# Patient Record
Sex: Male | Born: 1965
Health system: Southern US, Community
[De-identification: ages and names within clinical notes are randomized; demographics above are authoritative.]

## PROBLEM LIST (undated history)

## (undated) DIAGNOSIS — E785 Hyperlipidemia, unspecified: Secondary | ICD-10-CM

## (undated) DIAGNOSIS — I10 Essential (primary) hypertension: Secondary | ICD-10-CM

## (undated) DIAGNOSIS — K219 Gastro-esophageal reflux disease without esophagitis: Secondary | ICD-10-CM

## (undated) DIAGNOSIS — R7303 Prediabetes: Secondary | ICD-10-CM

## (undated) HISTORY — DX: Hyperlipidemia, unspecified: E78.5

## (undated) HISTORY — DX: Prediabetes: R73.03

---

## 2011-09-04 ENCOUNTER — Emergency Department: Payer: Self-pay | Admitting: Emergency Medicine

## 2012-07-03 ENCOUNTER — Emergency Department: Payer: Self-pay | Admitting: Emergency Medicine

## 2014-05-12 ENCOUNTER — Emergency Department: Payer: Self-pay | Admitting: Emergency Medicine

## 2015-05-07 ENCOUNTER — Emergency Department
Admission: EM | Admit: 2015-05-07 | Discharge: 2015-05-07 | Disposition: A | Discharge status: Home / Self Care | Payer: No Typology Code available for payment source | Attending: Emergency Medicine | Admitting: Emergency Medicine

## 2015-05-07 ENCOUNTER — Encounter: Payer: Self-pay | Admitting: Emergency Medicine

## 2015-05-07 DIAGNOSIS — Z98818 Other dental procedure status: Secondary | ICD-10-CM | POA: Insufficient documentation

## 2015-05-07 DIAGNOSIS — K0889 Other specified disorders of teeth and supporting structures: Secondary | ICD-10-CM

## 2015-05-07 DIAGNOSIS — G8918 Other acute postprocedural pain: Secondary | ICD-10-CM | POA: Insufficient documentation

## 2015-05-07 DIAGNOSIS — K08409 Partial loss of teeth, unspecified cause, unspecified class: Secondary | ICD-10-CM | POA: Insufficient documentation

## 2015-05-07 DIAGNOSIS — K088 Other specified disorders of teeth and supporting structures: Secondary | ICD-10-CM | POA: Insufficient documentation

## 2015-05-07 DIAGNOSIS — Z87891 Personal history of nicotine dependence: Secondary | ICD-10-CM | POA: Insufficient documentation

## 2015-05-07 LAB — CBC WITH DIFFERENTIAL/PLATELET
BASOS ABS: 0 10*3/uL (ref 0–0.1)
Basophils Relative: 0 %
Eosinophils Absolute: 0.1 10*3/uL (ref 0–0.7)
Eosinophils Relative: 1 %
HCT: 44 % (ref 40.0–52.0)
Hemoglobin: 14.1 g/dL (ref 13.0–18.0)
LYMPHS PCT: 39 %
Lymphs Abs: 3.7 10*3/uL — ABNORMAL HIGH (ref 1.0–3.6)
MCH: 28.3 pg (ref 26.0–34.0)
MCHC: 32 g/dL (ref 32.0–36.0)
MCV: 88.6 fL (ref 80.0–100.0)
Monocytes Absolute: 0.7 10*3/uL (ref 0.2–1.0)
Monocytes Relative: 8 %
NEUTROS ABS: 5 10*3/uL (ref 1.4–6.5)
Neutrophils Relative %: 52 %
PLATELETS: 309 10*3/uL (ref 150–440)
RBC: 4.97 MIL/uL (ref 4.40–5.90)
RDW: 14 % (ref 11.5–14.5)
WBC: 9.6 10*3/uL (ref 3.8–10.6)

## 2015-05-07 MED ORDER — TRAMADOL HCL 50 MG PO TABS: 50.0000 mg | ORAL_TABLET | Freq: Two times a day (BID) | ORAL | Status: DC

## 2015-05-07 MED ORDER — TRAMADOL HCL 50 MG PO TABS
100.0000 mg | ORAL_TABLET | Freq: Two times a day (BID) | ORAL | Status: DC | PRN
  Administered 2015-05-07: 100 mg via ORAL
  Filled 2015-05-07: qty 2

## 2015-05-07 NOTE — ED Notes (Signed)
Pt states he had 2 teeth pulled yesterday am, states he has been bleeding in mouth since, denies being on any blood thinners, pt has gauze in mouth at present, still having some bleeding at present

## 2015-05-07 NOTE — ED Provider Notes (Signed)
[facility] Emergency Department Provider Note ____________________________________________  Time seen: 1929  I have reviewed the triage vital signs and the nursing notes.  HISTORY  Chief Complaint  Post-op Problem  HPI Austin Hicks is a 49 y.o. male reports to the ED for c/o continued bleeding from his recently extracted molars on the lower left jaw. He has been dosing Motrin as directed. He reports some dizziness from swallowing some blood.  He has been trying to let most of it drain out of his mouth. He claims his pillow was soaked from the drainage out of his mouth overnight.   History reviewed. No pertinent past medical history.  There are no active problems to display for this patient.  History reviewed. No pertinent past surgical history.  Current Outpatient Rx  Name  Route  Sig  Dispense  Refill  . traMADol (ULTRAM) 50 MG tablet   Oral   Take 1 tablet (50 mg total) by mouth 2 (two) times daily.   10 tablet   0    Allergies Review of patient's allergies indicates no known allergies.  No family history on file.  Social History Social History  Substance Use Topics  . Smoking status: Former Games developer  . Smokeless tobacco: None  . Alcohol Use: Yes   Review of Systems  Constitutional: Negative for fever. Eyes: Negative for visual changes. ENT: Negative for sore throat. Reports continued bleeding s/p extraction Cardiovascular: Negative for chest pain. Respiratory: Negative for shortness of breath. Gastrointestinal: Negative for abdominal pain, vomiting and diarrhea. Genitourinary: Negative for dysuria. Musculoskeletal: Negative for back pain. Skin: Negative for rash. Neurological: Negative for headaches, focal weakness or numbness. ____________________________________________  PHYSICAL EXAM:  VITAL SIGNS: ED Triage Vitals  Enc Vitals Group     BP 05/07/15 1838 132/82 mmHg     Pulse Rate 05/07/15 1838 109     Resp 05/07/15 1838 18     Temp 05/07/15 1838 99.2 F (37.3 C)     Temp Source 05/07/15 1838 Oral     SpO2 05/07/15 1838 97 %     Weight 05/07/15 1838 200 lb (90.719 kg)     Height 05/07/15 1838 5\' 10"  (1.778 m)     Head Cir --      Peak Flow --      Pain Score 05/07/15 1921 8     Pain Loc --      Pain Edu? --      Excl. in [place]? --    Constitutional: Alert and oriented. Well appearing and in no distress. Eyes: Conjunctivae are normal. PERRL. Normal extraocular movements. ENT   Head: Normocephalic and atraumatic.   Nose: No congestion/rhinnorhea.   Mouth/Throat: Mucous membranes are moist. Left lower molar s/p extraction. Socket with bloody granulation tissue noted with slow, bloody drainage.    Neck: Supple. No thyromegaly. Hematological/Lymphatic/Immunilogical: No cervical lymphadenopathy. Cardiovascular: Normal rate, regular rhythm.  Respiratory: Normal respiratory effort. No wheezes/rales/rhonchi. Gastrointestinal: Soft and nontender. No distention. Musculoskeletal: Nontender with normal range of motion in all extremities.  Neurologic:  Normal gait without ataxia. Normal speech and language. No gross focal neurologic deficits are appreciated. Skin:  Skin is warm, dry and intact. No rash noted. Psychiatric: Mood and affect are normal. Patient exhibits appropriate insight and judgment. ____________________________________________    LABS (pertinent positives/negatives) Labs Reviewed  CBC WITH DIFFERENTIAL/PLATELET - Abnormal; Notable for the following:    Lymphs Abs 3.7 (*)    All other components within normal limits  ____________________________________________  PROCEDURES  Ultram 100 mg PO ____________________________________________  INITIAL IMPRESSION / ASSESSMENT AND PLAN / ED COURSE  Tooth extraction with some continued bleeding. No signs of infection or dry socket. Bleeding controlled in ED with direct pressure using gauze. Patient advised to rinse with salty water and continue  to apply gauze as needed.  Follow-up with dental provider on Monday. Return as needed. ____________________________________________  FINAL CLINICAL IMPRESSION(S) / ED DIAGNOSES  Final diagnoses:  Pain, dental  S/P tooth extraction, unspecified edentulism     Lissa Hoard, PA-C 05/08/15 1728  Maurilio Lovely, MD 05/10/15 2017

## 2018-06-13 DIAGNOSIS — E785 Hyperlipidemia, unspecified: Secondary | ICD-10-CM

## 2018-06-13 DIAGNOSIS — R7303 Prediabetes: Secondary | ICD-10-CM

## 2018-06-13 HISTORY — DX: Prediabetes: R73.03

## 2018-06-13 HISTORY — DX: Hyperlipidemia, unspecified: E78.5

## 2018-10-20 ENCOUNTER — Emergency Department
Admission: EM | Admit: 2018-10-20 | Discharge: 2018-10-20 | Disposition: A | Discharge status: Home / Self Care | Payer: Managed Care, Other (non HMO) | Attending: Student in an Organized Health Care Education/Training Program | Admitting: Student in an Organized Health Care Education/Training Program

## 2018-10-20 ENCOUNTER — Other Ambulatory Visit: Payer: Self-pay

## 2018-10-20 ENCOUNTER — Emergency Department: Payer: Managed Care, Other (non HMO)

## 2018-10-20 ENCOUNTER — Encounter: Payer: Self-pay | Admitting: Emergency Medicine

## 2018-10-20 DIAGNOSIS — R319 Hematuria, unspecified: Secondary | ICD-10-CM | POA: Diagnosis not present

## 2018-10-20 DIAGNOSIS — R109 Unspecified abdominal pain: Secondary | ICD-10-CM | POA: Insufficient documentation

## 2018-10-20 DIAGNOSIS — F17290 Nicotine dependence, other tobacco product, uncomplicated: Secondary | ICD-10-CM | POA: Diagnosis not present

## 2018-10-20 DIAGNOSIS — R350 Frequency of micturition: Secondary | ICD-10-CM | POA: Diagnosis not present

## 2018-10-20 DIAGNOSIS — R310 Gross hematuria: Secondary | ICD-10-CM | POA: Diagnosis present

## 2018-10-20 LAB — CBC WITH DIFFERENTIAL/PLATELET
ABS IMMATURE GRANULOCYTES: 0.02 10*3/uL (ref 0.00–0.07)
BASOS ABS: 0 10*3/uL (ref 0.0–0.1)
Basophils Relative: 1 %
Eosinophils Absolute: 0 10*3/uL (ref 0.0–0.5)
Eosinophils Relative: 1 %
HEMATOCRIT: 48.3 % (ref 39.0–52.0)
HEMOGLOBIN: 15.7 g/dL (ref 13.0–17.0)
Immature Granulocytes: 0 %
LYMPHS ABS: 1.5 10*3/uL (ref 0.7–4.0)
LYMPHS PCT: 32 %
MCH: 28.3 pg (ref 26.0–34.0)
MCHC: 32.5 g/dL (ref 30.0–36.0)
MCV: 87 fL (ref 80.0–100.0)
Monocytes Absolute: 1.4 10*3/uL — ABNORMAL HIGH (ref 0.1–1.0)
Monocytes Relative: 30 %
NEUTROS ABS: 1.7 10*3/uL (ref 1.7–7.7)
NRBC: 0 % (ref 0.0–0.2)
Neutrophils Relative %: 36 %
Platelets: 260 10*3/uL (ref 150–400)
RBC: 5.55 MIL/uL (ref 4.22–5.81)
RDW: 13.7 % (ref 11.5–15.5)
WBC: 4.8 10*3/uL (ref 4.0–10.5)

## 2018-10-20 LAB — URINALYSIS, COMPLETE (UACMP) WITH MICROSCOPIC
BACTERIA UA: NONE SEEN
Bilirubin Urine: NEGATIVE
Glucose, UA: NEGATIVE mg/dL
Ketones, ur: NEGATIVE mg/dL
LEUKOCYTES UA: NEGATIVE
Nitrite: NEGATIVE
PH: 6 (ref 5.0–8.0)
Protein, ur: NEGATIVE mg/dL
Specific Gravity, Urine: 1.019 (ref 1.005–1.030)

## 2018-10-20 LAB — COMPREHENSIVE METABOLIC PANEL
ALBUMIN: 4.4 g/dL (ref 3.5–5.0)
ALK PHOS: 83 U/L (ref 38–126)
ALT: 42 U/L (ref 0–44)
AST: 49 U/L — ABNORMAL HIGH (ref 15–41)
Anion gap: 7 (ref 5–15)
BUN: 14 mg/dL (ref 6–20)
CALCIUM: 9.2 mg/dL (ref 8.9–10.3)
CO2: 27 mmol/L (ref 22–32)
CREATININE: 0.98 mg/dL (ref 0.61–1.24)
Chloride: 101 mmol/L (ref 98–111)
GFR calc Af Amer: >60 mL/min (ref >60)
GFR calc non Af Amer: >60 mL/min (ref >60)
GLUCOSE: 104 mg/dL — AB (ref 70–99)
Potassium: 4.2 mmol/L (ref 3.5–5.1)
SODIUM: 135 mmol/L (ref 135–145)
Total Bilirubin: 0.8 mg/dL (ref 0.3–1.2)
Total Protein: 8 g/dL (ref 6.5–8.1)

## 2018-10-20 MED ORDER — TAMSULOSIN HCL 0.4 MG PO CAPS: 0.4000 mg | ORAL_CAPSULE | Freq: Every day | ORAL | 0 refills | Status: AC

## 2018-10-20 NOTE — ED Triage Notes (Signed)
Pt to ED via POV c/o hematuria x 1 day. Pt states that he has had some lower abdominal and back pain over the last few days and some pain when urinating. Pt denies hx/o kidney stones. Pt is in NAD.

## 2018-10-20 NOTE — ED Provider Notes (Signed)
[facility] Emergency Department Provider Note    First MD Initiated Contact with Patient 10/20/18 1000     (approximate)  I have reviewed the triage vital signs and the nursing notes.   HISTORY  Chief Complaint Hematuria    HPI Austin Hicks is a 53 y.o. male presents the ER for evaluation of blood in his urine that he noted last night.  States he is also had some intermittent left flank pain.  Denies any history of kidney stones.  He does smoke cigars.  He is not on any blood thinners.  Denies any urinary discomfort but feels like he has been going more frequently.    History reviewed. No pertinent past medical history. No family history on file. History reviewed. No pertinent surgical history. There are no active problems to display for this patient.     Prior to Admission medications   Medication Sig Start Date End Date Taking? Authorizing Provider  tamsulosin (FLOMAX) 0.4 MG CAPS capsule Take 1 capsule (0.4 mg total) by mouth daily after supper. 10/20/18   Willy Eddyobinson, Antonieta Slaven, MD  traMADol (ULTRAM) 50 MG tablet Take 1 tablet (50 mg total) by mouth 2 (two) times daily. 05/07/15   Menshew, Charlesetta IvoryJenise V Bacon, PA-C    Allergies Patient has no known allergies.    Social History Social History   Tobacco Use  . Smoking status: Former Games developermoker  . Smokeless tobacco: Never Used  Substance Use Topics  . Alcohol use: Yes  . Drug use: No    Review of Systems Patient denies headaches, rhinorrhea, blurry vision, numbness, shortness of breath, chest pain, edema, cough, abdominal pain, nausea, vomiting, diarrhea, dysuria, fevers, rashes or hallucinations unless otherwise stated above in HPI. ____________________________________________   PHYSICAL EXAM:  VITAL SIGNS: Vitals:   10/20/18 1103  BP: (!) 142/93  Pulse: 90  Resp: 16  SpO2: 96%    Constitutional: Alert and oriented.  Eyes: Conjunctivae are normal.  Head: Atraumatic. Nose: No  congestion/rhinnorhea. Mouth/Throat: Mucous membranes are moist.   Neck: No stridor. Painless ROM.  Cardiovascular: Normal rate, regular rhythm. Grossly normal heart sounds.  Good peripheral circulation. Respiratory: Normal respiratory effort.  No retractions. Lungs CTAB. Gastrointestinal: Soft and nontender. No distention. No abdominal bruits. No CVA tenderness. Genitourinary: deferred Musculoskeletal: No lower extremity tenderness nor edema.  No joint effusions. Neurologic:  Normal speech and language. No gross focal neurologic deficits are appreciated. No facial droop Skin:  Skin is warm, dry and intact. No rash noted. Psychiatric: Mood and affect are normal. Speech and behavior are normal.  ____________________________________________   LABS (all labs ordered are listed, but only abnormal results are displayed)  Results for orders placed or performed during the hospital encounter of 10/20/18 (from the past 24 hour(s))  Urinalysis, Complete w Microscopic     Status: Abnormal   Collection Time: 10/20/18 11:03 AM  Result Value Ref Range   Color, Urine YELLOW (A) YELLOW   APPearance CLEAR (A) CLEAR   Specific Gravity, Urine 1.019 1.005 - 1.030   pH 6.0 5.0 - 8.0   Glucose, UA NEGATIVE NEGATIVE mg/dL   Hgb urine dipstick LARGE (A) NEGATIVE   Bilirubin Urine NEGATIVE NEGATIVE   Ketones, ur NEGATIVE NEGATIVE mg/dL   Protein, ur NEGATIVE NEGATIVE mg/dL   Nitrite NEGATIVE NEGATIVE   Leukocytes, UA NEGATIVE NEGATIVE   RBC / HPF >50 (H) 0 - 5 RBC/hpf   WBC, UA 0-5 0 - 5 WBC/hpf   Bacteria, UA NONE SEEN NONE  SEEN   Squamous Epithelial / LPF 0-5 0 - 5   Mucus PRESENT   CBC with Differential/Platelet     Status: Abnormal   Collection Time: 10/20/18 11:03 AM  Result Value Ref Range   WBC 4.8 4.0 - 10.5 K/uL   RBC 5.55 4.22 - 5.81 MIL/uL   Hemoglobin 15.7 13.0 - 17.0 g/dL   HCT 49.1 79.1 - 50.5 %   MCV 87.0 80.0 - 100.0 fL   MCH 28.3 26.0 - 34.0 pg   MCHC 32.5 30.0 - 36.0 g/dL    RDW 69.7 94.8 - 01.6 %   Platelets 260 150 - 400 K/uL   nRBC 0.0 0.0 - 0.2 %   Neutrophils Relative % 36 %   Neutro Abs 1.7 1.7 - 7.7 K/uL   Lymphocytes Relative 32 %   Lymphs Abs 1.5 0.7 - 4.0 K/uL   Monocytes Relative 30 %   Monocytes Absolute 1.4 (H) 0.1 - 1.0 K/uL   Eosinophils Relative 1 %   Eosinophils Absolute 0.0 0.0 - 0.5 K/uL   Basophils Relative 1 %   Basophils Absolute 0.0 0.0 - 0.1 K/uL   Immature Granulocytes 0 %   Abs Immature Granulocytes 0.02 0.00 - 0.07 K/uL  Comprehensive metabolic panel     Status: Abnormal   Collection Time: 10/20/18 11:03 AM  Result Value Ref Range   Sodium 135 135 - 145 mmol/L   Potassium 4.2 3.5 - 5.1 mmol/L   Chloride 101 98 - 111 mmol/L   CO2 27 22 - 32 mmol/L   Glucose, Bld 104 (H) 70 - 99 mg/dL   BUN 14 6 - 20 mg/dL   Creatinine, Ser 5.53 0.61 - 1.24 mg/dL   Calcium 9.2 8.9 - 74.8 mg/dL   Total Protein 8.0 6.5 - 8.1 g/dL   Albumin 4.4 3.5 - 5.0 g/dL   AST 49 (H) 15 - 41 U/L   ALT 42 0 - 44 U/L   Alkaline Phosphatase 83 38 - 126 U/L   Total Bilirubin 0.8 0.3 - 1.2 mg/dL   GFR calc non Af Amer >60 >60 mL/min   GFR calc Af Amer >60 >60 mL/min   Anion gap 7 5 - 15   ____________________________________________  ____________________________________________  RADIOLOGY  I personally reviewed all radiographic images ordered to evaluate for the above acute complaints and reviewed radiology reports and findings.  These findings were personally discussed with the patient.  Please see medical record for radiology report.  ____________________________________________   PROCEDURES  Procedure(s) performed:  Procedures    Critical Care performed: no ____________________________________________   INITIAL IMPRESSION / ASSESSMENT AND PLAN / ED COURSE  Pertinent labs & imaging results that were available during my care of the patient were reviewed by me and considered in my medical decision making (see chart for details).   DDX:  stone, prostatitis, urethritis, mass  Austin Hicks is a 53 y.o. who presents to the ED with symptoms as described above.  Patient well-appearing and nontoxic.  Based on his age and risk factors will order imaging as well as urinalysis and basic blood work to evaluate for the above differential.  Clinical Course as of Oct 20 1154  Sun Oct 20, 2018  1150 Discussed results with patient.  Blood work is reassuring.  Does have hematuria no evidence of stone.  Will send for referral to urology for further evaluation.   [PR]    Clinical Course User Index [PR] Willy Eddy, MD  As part of my medical decision making, I reviewed the following data within the electronic MEDICAL RECORD NUMBER Nursing notes reviewed and incorporated, Labs reviewed, notes from prior ED visits.   ____________________________________________   FINAL CLINICAL IMPRESSION(S) / ED DIAGNOSES  Final diagnoses:  Hematuria, unspecified type      NEW MEDICATIONS STARTED DURING THIS VISIT:  New Prescriptions   TAMSULOSIN (FLOMAX) 0.4 MG CAPS CAPSULE    Take 1 capsule (0.4 mg total) by mouth daily after supper.     Note:  This document was prepared using Dragon voice recognition software and may include unintentional dictation errors.    Willy Eddy, MD 10/20/18 [phone]

## 2018-10-24 ENCOUNTER — Ambulatory Visit
Admission: RE | Admit: 2018-10-24 | Discharge: 2018-10-24 | Disposition: A | Discharge status: Home / Self Care | Payer: Managed Care, Other (non HMO) | Source: Ambulatory Visit | Attending: Urology | Admitting: Urology

## 2018-10-24 ENCOUNTER — Encounter: Payer: Self-pay | Admitting: Urology

## 2018-10-24 ENCOUNTER — Ambulatory Visit (INDEPENDENT_AMBULATORY_CARE_PROVIDER_SITE_OTHER): Payer: Managed Care, Other (non HMO) | Admitting: Urology

## 2018-10-24 VITALS — BP 118/70 | HR 84 | Ht 71.0 in | Wt 215.0 lb

## 2018-10-24 DIAGNOSIS — R319 Hematuria, unspecified: Secondary | ICD-10-CM | POA: Diagnosis not present

## 2018-10-24 DIAGNOSIS — R109 Unspecified abdominal pain: Secondary | ICD-10-CM

## 2018-10-24 LAB — URINALYSIS, COMPLETE
BILIRUBIN UA: NEGATIVE
GLUCOSE, UA: NEGATIVE
KETONES UA: NEGATIVE
Leukocytes, UA: NEGATIVE
NITRITE UA: NEGATIVE
UUROB: 1 mg/dL (ref 0.2–1.0)
pH, UA: 6 (ref 5.0–7.5)

## 2018-10-24 LAB — MICROSCOPIC EXAMINATION
Epithelial Cells (non renal): NONE SEEN /hpf (ref 0–10)
WBC UA: NONE SEEN /HPF (ref 0–5)

## 2018-10-24 NOTE — Progress Notes (Signed)
   10/24/2018 3:42 PM   Ledora Bottcher Sep 04, 1966 [id number]  CC: Left flank pain and hematuria  HPI: I saw Mr. Holton in clinic today in follow-up from the emergency department for left-sided flank pain with hematuria.  He presented to the emergency department on 10/20/2018 with severe 8/10 left-sided groin pain and gross hematuria, as well as a few day history of urinary frequency, urgency, and nocturia.  Renal ultrasound and KUB did not demonstrate definitive evidence of urolithiasis, and there is no hydronephrosis.  Urinalysis showed greater than 50 RBCs, but no other sign of infection.  His pain improved with tramadol and Flomax.  He is still had some ongoing left-sided groin discomfort, but his hematuria has resolved.  There is no radiation of the pain.  There are no aggravating factors.  He reports minimal smoking history of an occasional cigar over the last year.  There is no family history of kidney or bladder cancer.  He denies any prior episodes of hematuria.  He denies any prior episodes of urolithiasis.  There is no family history of kidney stones.  PMH: Past Medical History:  Diagnosis Date  . HLD (hyperlipidemia) 06/13/2018  . Pre-diabetes 06/13/2018    Surgical History: No past surgical history on file.  Allergies:  Allergies  Allergen Reactions  . Wasp Venom Shortness Of Breath and Swelling    Other reaction(s): Angioedema    Family History: No family history on file.  Social History:  reports that he has quit smoking. He has never used smokeless tobacco. He reports current alcohol use. He reports that he does not use drugs.  ROS: Please see flowsheet from today's date for complete review of systems.  Physical Exam: BP 118/70   Pulse 84   Ht 5\' 11"  (1.803 m)   Wt 215 lb (97.5 kg)   BMI 29.99 kg/m    Constitutional:  Alert and oriented, No acute distress. Cardiovascular: No clubbing, cyanosis, or edema. Respiratory: Normal respiratory effort, no increased  work of breathing. GI: Abdomen is soft, nontender, nondistended, no abdominal masses GU: No CVA tenderness Lymph: No cervical or inguinal lymphadenopathy. Skin: No rashes, bruises or suspicious lesions. Neurologic: Grossly intact, no focal deficits, moving all 4 extremities. Psychiatric: Normal mood and affect.  Laboratory Data: Reviewed, urinalysis today 0-2 RBCs,0 WBCs, few bacteria, nitrite negative  Pertinent Imaging: I have personally reviewed the renal ultrasound and KUB, no hydronephrosis, left calcifications in the pelvis.  Assessment & Plan:   In summary, the patient is a 54 year old male with acute onset of left-sided groin pain and gross hematuria concerning for possible episode of urolithiasis.  There was no evidence of hydronephrosis on renal ultrasound, and KUB showed pelvic calcifications most consistent with phleboliths.  He is still having some occasional left-sided groin pain, however his gross hematuria has resolved.  With his clear onset of hematuria with left-sided pain, I think stone disease is the most likely etiology.  I did recommend proceeding with a CT stone protocol today to rule out residual left-sided fragments with his mild persistent symptoms.  We discussed at length the importance of following up if he develops recurrent hematuria or groin/flank pain.  CT stone protocol today, will call with results  Sondra Come, MD  [facility] [address] [place], [place] [address] [phone]

## 2018-10-25 ENCOUNTER — Telehealth: Payer: Self-pay | Admitting: Family Medicine

## 2018-10-25 NOTE — Telephone Encounter (Signed)
LMOM for patient to return call.

## 2018-10-25 NOTE — Telephone Encounter (Signed)
-----   Message from Tim C Sninsky, MD sent at 10/24/2018  4:47 PM EST ----- °Please let him know no kidney stones were seen on his CT scan.  There was a small amount of inflammation near the bowel in the left groin, likely from a mild infection that is resolving and caused his pain.  If he develops recurrent blood in the urine he should call the urology clinic for follow-up.  If he develops recurrent pain he should contact his PCP for consideration of antibiotics. ° °Follow-up 3 months with urinalysis to rule out recurrent hematuria. ° °Kyel Sninsky, MD °10/24/2018 ° °

## 2018-10-29 NOTE — Telephone Encounter (Signed)
Left pt mess 

## 2018-10-30 ENCOUNTER — Telehealth: Payer: Self-pay | Admitting: Urology

## 2018-10-30 NOTE — Telephone Encounter (Signed)
Patient notified

## 2018-10-30 NOTE — Telephone Encounter (Signed)
3 month follow up made and mailed to pt

## 2018-10-30 NOTE — Telephone Encounter (Signed)
-----   Message from Sondra ComeBrian C Sninsky, MD sent at 10/24/2018  4:47 PM EST ----- Please let him know no kidney stones were seen on his CT scan.  There was a small amount of inflammation near the bowel in the left groin, likely from a mild infection that is resolving and caused his pain.  If he develops recurrent blood in the urine he should call the urology clinic for follow-up.  If he develops recurrent pain he should contact his PCP for consideration of antibiotics.  Follow-up 3 months with urinalysis to rule out recurrent hematuria.  Legrand RamsBrian Sninsky, MD 10/24/2018

## 2019-01-28 ENCOUNTER — Ambulatory Visit: Payer: Managed Care, Other (non HMO) | Admitting: Urology

## 2019-03-31 ENCOUNTER — Ambulatory Visit: Payer: Managed Care, Other (non HMO) | Admitting: Urology

## 2019-03-31 ENCOUNTER — Encounter: Payer: Self-pay | Admitting: Urology

## 2019-11-18 ENCOUNTER — Emergency Department: Payer: 59

## 2019-11-18 ENCOUNTER — Other Ambulatory Visit: Payer: Self-pay

## 2019-11-18 ENCOUNTER — Emergency Department
Admission: EM | Admit: 2019-11-18 | Discharge: 2019-11-18 | Disposition: A | Discharge status: Home / Self Care | Payer: 59 | Attending: Emergency Medicine | Admitting: Emergency Medicine

## 2019-11-18 ENCOUNTER — Encounter: Payer: Self-pay | Admitting: Emergency Medicine

## 2019-11-18 DIAGNOSIS — Z79899 Other long term (current) drug therapy: Secondary | ICD-10-CM | POA: Insufficient documentation

## 2019-11-18 DIAGNOSIS — K219 Gastro-esophageal reflux disease without esophagitis: Secondary | ICD-10-CM

## 2019-11-18 DIAGNOSIS — R0789 Other chest pain: Secondary | ICD-10-CM | POA: Diagnosis present

## 2019-11-18 LAB — CBC WITH DIFFERENTIAL/PLATELET
Abs Immature Granulocytes: 0.01 10*3/uL (ref 0.00–0.07)
Basophils Absolute: 0 10*3/uL (ref 0.0–0.1)
Basophils Relative: 0 %
Eosinophils Absolute: 0.1 10*3/uL (ref 0.0–0.5)
Eosinophils Relative: 1 %
HCT: 47.4 % (ref 39.0–52.0)
Hemoglobin: 15.3 g/dL (ref 13.0–17.0)
Immature Granulocytes: 0 %
Lymphocytes Relative: 36 %
Lymphs Abs: 2 10*3/uL (ref 0.7–4.0)
MCH: 28.2 pg (ref 26.0–34.0)
MCHC: 32.3 g/dL (ref 30.0–36.0)
MCV: 87.3 fL (ref 80.0–100.0)
Monocytes Absolute: 0.7 10*3/uL (ref 0.1–1.0)
Monocytes Relative: 13 %
Neutro Abs: 2.7 10*3/uL (ref 1.7–7.7)
Neutrophils Relative %: 50 %
Platelets: 349 10*3/uL (ref 150–400)
RBC: 5.43 MIL/uL (ref 4.22–5.81)
RDW: 13.2 % (ref 11.5–15.5)
WBC: 5.5 10*3/uL (ref 4.0–10.5)
nRBC: 0 % (ref 0.0–0.2)

## 2019-11-18 LAB — COMPREHENSIVE METABOLIC PANEL
ALT: 33 U/L (ref 0–44)
AST: 27 U/L (ref 15–41)
Albumin: 4.3 g/dL (ref 3.5–5.0)
Alkaline Phosphatase: 79 U/L (ref 38–126)
Anion gap: 8 (ref 5–15)
BUN: 10 mg/dL (ref 6–20)
CO2: 30 mmol/L (ref 22–32)
Calcium: 9.6 mg/dL (ref 8.9–10.3)
Chloride: 102 mmol/L (ref 98–111)
Creatinine, Ser: 0.86 mg/dL (ref 0.61–1.24)
GFR calc Af Amer: >60 mL/min (ref >60)
GFR calc non Af Amer: >60 mL/min (ref >60)
Glucose, Bld: 120 mg/dL — ABNORMAL HIGH (ref 70–99)
Potassium: 3.9 mmol/L (ref 3.5–5.1)
Sodium: 140 mmol/L (ref 135–145)
Total Bilirubin: 0.7 mg/dL (ref 0.3–1.2)
Total Protein: 7.7 g/dL (ref 6.5–8.1)

## 2019-11-18 LAB — TROPONIN I (HIGH SENSITIVITY): Troponin I (High Sensitivity): 3 ng/L (ref <18)

## 2019-11-18 MED ORDER — PANTOPRAZOLE SODIUM 40 MG PO TBEC
40.0000 mg | DELAYED_RELEASE_TABLET | Freq: Once | ORAL | Status: AC
  Administered 2019-11-18: 04:00:00 40 mg via ORAL
  Filled 2019-11-18: qty 1

## 2019-11-18 MED ORDER — PANTOPRAZOLE SODIUM 40 MG PO TBEC
40.0000 mg | DELAYED_RELEASE_TABLET | Freq: Every day | ORAL | 1 refills | Status: AC
Start: 2019-11-18 — End: 2024-04-28

## 2019-11-18 NOTE — ED Provider Notes (Signed)
[facility] Emergency Department Provider Note  ____________________________________________   First MD Initiated Contact with Patient 11/18/19 0405     (approximate)  I have reviewed the triage vital signs and the nursing notes.   HISTORY  Chief Complaint Chest Pain    HPI Austin Hicks is a 54 y.o. male with below list of previous medical conditions including hyperlipidemia and prediabetes presents to the emergency department secondary to continued burning midline chest pain radiating to his throat times "a long time".  Patient states that discomfort is worse when he lays down at night.  Patient was seen by Dr. Netty Starring for the same on 10/28/2019 for which she was prescribed Pepcid however the patient states "I lost the bottle".  Patient denies any discomfort at present no difficulty breathing.  Patient denies any lower extremity pain or swelling.  Patient denies any nausea or vomiting.      Past Medical History:  Diagnosis Date  . HLD (hyperlipidemia) 06/13/2018  . Pre-diabetes 06/13/2018    Patient Active Problem List   Diagnosis Date Noted  . HLD (hyperlipidemia) 06/13/2018  . Pre-diabetes 06/13/2018    History reviewed. No pertinent surgical history.  Prior to Admission medications   Medication Sig Start Date End Date Taking? Authorizing Provider  tamsulosin (FLOMAX) 0.4 MG CAPS capsule Take 1 capsule (0.4 mg total) by mouth daily after supper. 10/20/18   Merlyn Lot, MD    Allergies Wasp venom  No family history on file.  Social History Social History   Tobacco Use  . Smoking status: Former Research scientist (life sciences)  . Smokeless tobacco: Never Used  Substance Use Topics  . Alcohol use: Yes  . Drug use: No    Review of Systems Constitutional: No fever/chills Eyes: No visual changes. ENT: No sore throat. Cardiovascular: Positive for reported chest pain (none at present). Respiratory: Denies shortness of breath. Gastrointestinal: No  abdominal pain.  No nausea, no vomiting.  No diarrhea.  No constipation. Genitourinary: Negative for dysuria. Musculoskeletal: Negative for neck pain.  Negative for back pain. Integumentary: Negative for rash. Neurological: Negative for headaches, focal weakness or numbness.   ____________________________________________   PHYSICAL EXAM:  VITAL SIGNS: ED Triage Vitals  Enc Vitals Group     BP 11/18/19 0226 136/80     Pulse Rate 11/18/19 0226 83     Resp --      Temp 11/18/19 0226 98.6 F (37 C)     Temp Source 11/18/19 0226 Oral     SpO2 11/18/19 0226 97 %     Weight 11/18/19 0215 95.7 kg (211 lb)     Height 11/18/19 0215 1.803 m (5\' 11" )     Head Circumference --      Peak Flow --      Pain Score 11/18/19 0215 7     Pain Loc --      Pain Edu? --      Excl. in [place]? --     Constitutional: Alert and oriented.  Eyes: Conjunctivae are normal.  Mouth/Throat: Patient is wearing a mask. Neck: No stridor.  No meningeal signs.   Cardiovascular: Normal rate, regular rhythm. Good peripheral circulation. Grossly normal heart sounds. Respiratory: Normal respiratory effort.  No retractions. Gastrointestinal: Soft and nontender. No distention.  Musculoskeletal: No lower extremity tenderness nor edema. No gross deformities of extremities. Neurologic:  Normal speech and language. No gross focal neurologic deficits are appreciated.  Skin:  Skin is warm, dry and intact. Psychiatric: Mood and affect are normal.  Speech and behavior are normal.  ____________________________________________   LABS (all labs ordered are listed, but only abnormal results are displayed)  Labs Reviewed  COMPREHENSIVE METABOLIC PANEL - Abnormal; Notable for the following components:      Result Value   Glucose, Bld 120 (*)    All other components within normal limits  CBC WITH DIFFERENTIAL/PLATELET  TROPONIN I (HIGH SENSITIVITY)  TROPONIN I (HIGH SENSITIVITY)    ____________________________________________  EKG  ED ECG REPORT I, [place] N Brenleigh Collet, the attending physician, personally viewed and interpreted this ECG.   Date: 11/18/2019  EKG Time: 2:21 AM  Rate: 76  Rhythm: Normal sinus rhythm  Axis: Normal  Intervals: Normal  ST&T Change: None  ____________________________________________  RADIOLOGY I, Willmar N Webber Michiels, personally viewed and evaluated these images (plain radiographs) as part of my medical decision making, as well as reviewing the written report by the radiologist.  ED MD interpretation: No active cardiopulmonary disease noted on chest x-ray per radiologist.  Official radiology report(s): DG Chest 2 View  Result Date: 11/18/2019 CLINICAL DATA:  Upper chest pain for 2 weeks EXAM: CHEST - 2 VIEW COMPARISON:  09/04/2011 FINDINGS: The heart size and mediastinal contours are within normal limits. Both lungs are clear. The visualized skeletal structures are unremarkable. IMPRESSION: No active cardiopulmonary disease. Electronically Signed   By: Sharlet Salina M.D.   On: 11/18/2019 02:40    _ Procedures   ____________________________________________   INITIAL IMPRESSION / MDM / ASSESSMENT AND PLAN / ED COURSE  As part of my medical decision making, I reviewed the following data within the electronic MEDICAL RECORD NUMBER   54 year old male presented with above-stated history and physical exam secondary to burning chest pain.  Differential diagnosis including but not limited to GERD, ACS.  EKG revealed no evidence of ischemia or infarction laboratory data including high-sensitivity troponin negative.  Patient has no chest pain at present.  Given previous visit to primary care provider for same and the fact that the patient lost his Pepcid with recurrence of pain I suspect this to be secondary to GERD.  Patient be prescribed Protonix for home with recommendation to follow-up with Dr. Burnadette Pop       ____________________________________________  FINAL CLINICAL IMPRESSION(S) / ED DIAGNOSES  Final diagnoses:  Gastroesophageal reflux disease, unspecified whether esophagitis present     MEDICATIONS GIVEN DURING THIS VISIT:  Medications  pantoprazole (PROTONIX) EC tablet 40 mg (has no administration in time range)     ED Discharge Orders    None      *Please note:  Austin Hicks was evaluated in Emergency Department on 11/18/2019 for the symptoms described in the history of present illness. He was evaluated in the context of the global COVID-19 pandemic, which necessitated consideration that the patient might be at risk for infection with the SARS-CoV-2 virus that causes COVID-19. Institutional protocols and algorithms that pertain to the evaluation of patients at risk for COVID-19 are in a state of rapid change based on information released by regulatory bodies including the CDC and federal and state organizations. These policies and algorithms were followed during the patient's care in the ED.  Some ED evaluations and interventions may be delayed as a result of limited staffing during the pandemic.*  Note:  This document was prepared using Dragon voice recognition software and may include unintentional dictation errors.   Darci Current, MD 11/18/19 [phone]

## 2019-11-18 NOTE — ED Triage Notes (Signed)
Patient ambulatory to triage with steady gait, without difficulty or distress noted, mask in place; pt reports x 2wks having upper CP accomp by HA

## 2019-12-13 ENCOUNTER — Ambulatory Visit: Payer: 59

## 2019-12-17 ENCOUNTER — Other Ambulatory Visit: Payer: Self-pay | Admitting: Family Medicine

## 2019-12-17 DIAGNOSIS — R519 Headache, unspecified: Secondary | ICD-10-CM

## 2019-12-17 DIAGNOSIS — H538 Other visual disturbances: Secondary | ICD-10-CM

## 2019-12-17 DIAGNOSIS — R42 Dizziness and giddiness: Secondary | ICD-10-CM

## 2019-12-18 ENCOUNTER — Encounter: Payer: Self-pay | Admitting: Emergency Medicine

## 2019-12-18 ENCOUNTER — Other Ambulatory Visit: Payer: Self-pay

## 2019-12-18 ENCOUNTER — Emergency Department: Payer: 59

## 2019-12-18 DIAGNOSIS — I10 Essential (primary) hypertension: Secondary | ICD-10-CM | POA: Insufficient documentation

## 2019-12-18 DIAGNOSIS — H538 Other visual disturbances: Secondary | ICD-10-CM | POA: Insufficient documentation

## 2019-12-18 DIAGNOSIS — Z87891 Personal history of nicotine dependence: Secondary | ICD-10-CM | POA: Insufficient documentation

## 2019-12-18 DIAGNOSIS — G8929 Other chronic pain: Secondary | ICD-10-CM | POA: Diagnosis not present

## 2019-12-18 DIAGNOSIS — Z79899 Other long term (current) drug therapy: Secondary | ICD-10-CM | POA: Diagnosis not present

## 2019-12-18 DIAGNOSIS — R519 Headache, unspecified: Secondary | ICD-10-CM | POA: Diagnosis present

## 2019-12-18 LAB — BASIC METABOLIC PANEL
Anion gap: 10 (ref 5–15)
BUN: 14 mg/dL (ref 6–20)
CO2: 25 mmol/L (ref 22–32)
Calcium: 9.6 mg/dL (ref 8.9–10.3)
Chloride: 101 mmol/L (ref 98–111)
Creatinine, Ser: 0.8 mg/dL (ref 0.61–1.24)
GFR calc Af Amer: >60 mL/min (ref >60)
GFR calc non Af Amer: >60 mL/min (ref >60)
Glucose, Bld: 101 mg/dL — ABNORMAL HIGH (ref 70–99)
Potassium: 3.8 mmol/L (ref 3.5–5.1)
Sodium: 136 mmol/L (ref 135–145)

## 2019-12-18 LAB — CBC
HCT: 46.3 % (ref 39.0–52.0)
Hemoglobin: 15 g/dL (ref 13.0–17.0)
MCH: 28.2 pg (ref 26.0–34.0)
MCHC: 32.4 g/dL (ref 30.0–36.0)
MCV: 87 fL (ref 80.0–100.0)
Platelets: 342 10*3/uL (ref 150–400)
RBC: 5.32 MIL/uL (ref 4.22–5.81)
RDW: 13.2 % (ref 11.5–15.5)
WBC: 6.9 10*3/uL (ref 4.0–10.5)
nRBC: 0 % (ref 0.0–0.2)

## 2019-12-18 LAB — TROPONIN I (HIGH SENSITIVITY): Troponin I (High Sensitivity): <2 ng/L (ref <18)

## 2019-12-18 NOTE — ED Triage Notes (Signed)
Pt presents to ER from home, reports having headache for several weeks that radiates pain to chest. Pt reports taking BP medication 5mg  amlodipine, and pantoprazole 40mg . Pt reports continues to have same symptoms. Pt talks in complete sentences no distress noted

## 2019-12-19 ENCOUNTER — Emergency Department
Admission: EM | Admit: 2019-12-19 | Discharge: 2019-12-19 | Disposition: A | Discharge status: Home / Self Care | Payer: 59 | Attending: Emergency Medicine | Admitting: Emergency Medicine

## 2019-12-19 ENCOUNTER — Emergency Department: Payer: 59

## 2019-12-19 DIAGNOSIS — G8929 Other chronic pain: Secondary | ICD-10-CM

## 2019-12-19 DIAGNOSIS — I10 Essential (primary) hypertension: Secondary | ICD-10-CM

## 2019-12-19 HISTORY — DX: Essential (primary) hypertension: I10

## 2019-12-19 HISTORY — DX: Gastro-esophageal reflux disease without esophagitis: K21.9

## 2019-12-19 LAB — TROPONIN I (HIGH SENSITIVITY): Troponin I (High Sensitivity): <2 ng/L (ref <18)

## 2019-12-19 MED ORDER — BUTALBITAL-APAP-CAFFEINE 50-325-40 MG PO TABS
1.0000 | ORAL_TABLET | Freq: Once | ORAL | Status: AC
  Administered 2019-12-19: 1 via ORAL
  Filled 2019-12-19: qty 1

## 2019-12-19 MED ORDER — BUTALBITAL-APAP-CAFFEINE 50-325-40 MG PO TABS: 1.0000 | ORAL_TABLET | ORAL | 0 refills | Status: AC | PRN

## 2019-12-19 NOTE — Discharge Instructions (Signed)
1.  Continue blood pressure medicine as directed by your doctor. 2.  You may take Fioricet as needed for headache. 3.  Return to the ER for worsening symptoms, persistent vomiting, difficulty breathing or other concerns.

## 2019-12-19 NOTE — ED Provider Notes (Signed)
[facility] Emergency Department Provider Note   ____________________________________________   First MD Initiated Contact with Patient 12/19/19 0133     (approximate)  I have reviewed the triage vital signs and the nursing notes.   HISTORY  Chief Complaint Chest Pain and Headache    HPI Austin Hicks is a 54 y.o. male who presents to the ED from home with a chief complaint of headache and high blood pressure.  Patient has been experiencing headaches and occasional blurry vision for the past several weeks.  PCP started him on amlodipine 2.5 mg 2 weeks ago.  He had a follow-up visit 3 days ago and amlodipine was increased to 5 mg.  CT head is ordered but not scheduled until mid April.  Patient reports headache on the vertex of his head radiating to his chest and not associated with vision changes, nausea or vomiting.  States overall headaches have improved since the increase in amlodipine.  Denies fever, cough, shortness of breath, abdominal pain, diarrhea.  Denies recent travel or trauma.        Past Medical History:  Diagnosis Date  . GERD (gastroesophageal reflux disease)   . HLD (hyperlipidemia) 06/13/2018  . Hypertension   . Pre-diabetes 06/13/2018    Patient Active Problem List   Diagnosis Date Noted  . HLD (hyperlipidemia) 06/13/2018  . Pre-diabetes 06/13/2018    History reviewed. No pertinent surgical history.  Prior to Admission medications   Medication Sig Start Date End Date Taking? Authorizing Provider  butalbital-acetaminophen-caffeine (FIORICET) 50-325-40 MG tablet Take 1 tablet by mouth every 4 (four) hours as needed for headache. 12/19/19   Irean Hong, MD  pantoprazole (PROTONIX) 40 MG tablet Take 1 tablet (40 mg total) by mouth daily. 11/18/19 11/17/20  Darci Current, MD  tamsulosin (FLOMAX) 0.4 MG CAPS capsule Take 1 capsule (0.4 mg total) by mouth daily after supper. 10/20/18   Willy Eddy, MD    Allergies Wasp  venom  No family history on file.  Social History Social History   Tobacco Use  . Smoking status: Former Games developer  . Smokeless tobacco: Never Used  Substance Use Topics  . Alcohol use: Yes  . Drug use: No    Review of Systems  Constitutional: No fever/chills Eyes: No visual changes. ENT: No sore throat. Cardiovascular: Denies chest pain. Respiratory: Denies shortness of breath. Gastrointestinal: No abdominal pain.  No nausea, no vomiting.  No diarrhea.  No constipation. Genitourinary: Negative for dysuria. Musculoskeletal: Negative for back pain. Skin: Negative for rash. Neurological: Positive for headaches. Negative for focal weakness or numbness.   ____________________________________________   PHYSICAL EXAM:  VITAL SIGNS: ED Triage Vitals  Enc Vitals Group     BP 12/18/19 2018 (!) 142/85     Pulse Rate 12/18/19 2018 87     Resp 12/18/19 2018 20     Temp 12/18/19 2018 98.4 F (36.9 C)     Temp Source 12/18/19 2018 Oral     SpO2 12/18/19 2018 98 %     Weight 12/18/19 2019 203 lb (92.1 kg)     Height 12/18/19 2019 5\' 11"  (1.803 m)     Head Circumference --      Peak Flow --      Pain Score 12/18/19 2019 6     Pain Loc --      Pain Edu? --      Excl. in GC? --     Constitutional: Alert and oriented. Well appearing and in  no acute distress. Eyes: Conjunctivae are normal. PERRL. EOMI. Head: Atraumatic. Nose: No congestion/rhinnorhea. Mouth/Throat: Mucous membranes are moist.  Oropharynx non-erythematous. Neck: No stridor.  Supple neck without meningismus.  No carotid bruits. Cardiovascular: Normal rate, regular rhythm. Grossly normal heart sounds.  Good peripheral circulation. Respiratory: Normal respiratory effort.  No retractions. Lungs CTAB. Gastrointestinal: Soft and nontender. No distention. No abdominal bruits. No CVA tenderness. Musculoskeletal: No lower extremity tenderness nor edema.  No joint effusions. Neurologic: Alert and oriented x3.  CN II-XII  grossly intact.  Normal speech and language. No gross focal neurologic deficits are appreciated. MAEx4. No gait instability. Skin:  Skin is warm, dry and intact. No rash noted.  No petechiae. Psychiatric: Mood and affect are normal. Speech and behavior are normal.  ____________________________________________   LABS (all labs ordered are listed, but only abnormal results are displayed)  Labs Reviewed  BASIC METABOLIC PANEL - Abnormal; Notable for the following components:      Result Value   Glucose, Bld 101 (*)    All other components within normal limits  CBC  TROPONIN I (HIGH SENSITIVITY)  TROPONIN I (HIGH SENSITIVITY)   ____________________________________________  EKG  ED ECG REPORT I, Josephus Harriger J, the attending physician, personally viewed and interpreted this ECG.   Date: 12/19/2019  EKG Time: 2026  Rate: 83  Rhythm: normal EKG, normal sinus rhythm  Axis: Normal  Intervals:none  ST&T Change: Nonspecific  ____________________________________________  RADIOLOGY  ED MD interpretation: No acute cardiopulmonary process  Official radiology report(s): DG Chest 2 View  Result Date: 12/18/2019 CLINICAL DATA:  Headache several weeks radiates to chest. EXAM: CHEST - 2 VIEW COMPARISON:  11/18/2019 FINDINGS: Lungs are adequately inflated without focal airspace consolidation or effusion. Cardiomediastinal silhouette and remainder the exam is unchanged. IMPRESSION: No active cardiopulmonary disease. Electronically Signed   By: Marin Olp M.D.   On: 12/18/2019 20:47   CT Head Wo Contrast  Result Date: 12/19/2019 CLINICAL DATA:  Initial evaluation for acute headache. EXAM: CT HEAD WITHOUT CONTRAST TECHNIQUE: Contiguous axial images were obtained from the base of the skull through the vertex without intravenous contrast. COMPARISON:  None. FINDINGS: Brain: Cerebral volume within normal limits for patient age. No evidence for acute intracranial hemorrhage. No findings to suggest  acute large vessel territory infarct. No mass lesion, midline shift, or mass effect. Ventricles are normal in size without evidence for hydrocephalus. No extra-axial fluid collection identified. Vascular: No hyperdense vessel identified. Skull: Scalp soft tissues demonstrate no acute abnormality. Calvarium intact. Sinuses/Orbits: Globes and orbital soft tissues within normal limits. Visualized paranasal sinuses are clear. No mastoid effusion. IMPRESSION: Negative head CT.  No acute intracranial abnormality. Electronically Signed   By: Jeannine Boga M.D.   On: 12/19/2019 02:16    ____________________________________________   PROCEDURES  Procedure(s) performed (including Critical Care):  .1-3 Lead EKG Interpretation Performed by: Paulette Blanch, MD Authorized by: Paulette Blanch, MD     Interpretation: normal     ECG rate:  80   ECG rate assessment: normal     Rhythm: sinus rhythm     Ectopy: none     Conduction: normal       ____________________________________________   INITIAL IMPRESSION / ASSESSMENT AND PLAN / ED COURSE  As part of my medical decision making, I reviewed the following data within the [facility] notes reviewed and incorporated, Labs reviewed, EKG interpreted, Old chart reviewed, Radiograph reviewed and Notes from prior ED visits     Austin Edelman  MADOC Hicks was evaluated in Emergency Department on 12/19/2019 for the symptoms described in the history of present illness. He was evaluated in the context of the global COVID-19 pandemic, which necessitated consideration that the patient might be at risk for infection with the SARS-CoV-2 virus that causes COVID-19. Institutional protocols and algorithms that pertain to the evaluation of patients at risk for COVID-19 are in a state of rapid change based on information released by regulatory bodies including the CDC and federal and state organizations. These policies and algorithms were followed during the  patient's care in the ED.    54 year old male who presents with a several week history of headache radiating to his chest, elevated blood pressure. Differential diagnosis includes, but is not limited to, intracranial hemorrhage, meningitis/encephalitis, previous head trauma, cavernous venous thrombosis, tension headache, temporal arteritis, migraine or migraine equivalent, idiopathic intracranial hypertension, and non-specific headache.  I have personally reviewed patient's chart and read his PCP visit from 12/16/2019.  Laboratory results unremarkable with 2 sets of negative troponins.  Will go ahead and obtain CT head in the ED.  Try Fioricet for headache.  Would not increase amlodipine at this time as it was just increased 3 days ago.  Clinical Course as of Dec 18 637  Fri Dec 19, 2019  0247 Updated patient on negative head CT.  Headache gone after Fioricet.  Patient is smiling and laughing.  Will discharge home with prescription for Fioricet and he will follow-up closely with his PCP.  Strict return precautions given.  Patient verbalizes understanding and agrees with plan of care.   [JS]    Clinical Course User Index [JS] Irean Hong, MD     ____________________________________________   FINAL CLINICAL IMPRESSION(S) / ED DIAGNOSES  Final diagnoses:  Essential hypertension  Chronic nonintractable headache, unspecified headache type     ED Discharge Orders         Ordered    butalbital-acetaminophen-caffeine (FIORICET) 50-325-40 MG tablet  Every 4 hours PRN     12/19/19 0248           Note:  This document was prepared using Dragon voice recognition software and may include unintentional dictation errors.   Irean Hong, MD 12/19/19 [phone]

## 2020-01-01 ENCOUNTER — Ambulatory Visit: Payer: 59

## 2020-01-05 ENCOUNTER — Other Ambulatory Visit: Payer: Self-pay

## 2020-01-05 ENCOUNTER — Emergency Department: Payer: 59

## 2020-01-05 ENCOUNTER — Emergency Department
Admission: EM | Admit: 2020-01-05 | Discharge: 2020-01-05 | Disposition: A | Discharge status: Home / Self Care | Payer: 59 | Attending: Emergency Medicine | Admitting: Emergency Medicine

## 2020-01-05 DIAGNOSIS — R1032 Left lower quadrant pain: Secondary | ICD-10-CM | POA: Diagnosis present

## 2020-01-05 DIAGNOSIS — Z87891 Personal history of nicotine dependence: Secondary | ICD-10-CM | POA: Insufficient documentation

## 2020-01-05 DIAGNOSIS — Z79899 Other long term (current) drug therapy: Secondary | ICD-10-CM | POA: Diagnosis not present

## 2020-01-05 DIAGNOSIS — K5792 Diverticulitis of intestine, part unspecified, without perforation or abscess without bleeding: Secondary | ICD-10-CM | POA: Insufficient documentation

## 2020-01-05 DIAGNOSIS — R109 Unspecified abdominal pain: Secondary | ICD-10-CM

## 2020-01-05 DIAGNOSIS — I10 Essential (primary) hypertension: Secondary | ICD-10-CM | POA: Insufficient documentation

## 2020-01-05 LAB — CBC
HCT: 45.8 % (ref 39.0–52.0)
Hemoglobin: 15.4 g/dL (ref 13.0–17.0)
MCH: 28.8 pg (ref 26.0–34.0)
MCHC: 33.6 g/dL (ref 30.0–36.0)
MCV: 85.6 fL (ref 80.0–100.0)
Platelets: 366 10*3/uL (ref 150–400)
RBC: 5.35 MIL/uL (ref 4.22–5.81)
RDW: 13.3 % (ref 11.5–15.5)
WBC: 6 10*3/uL (ref 4.0–10.5)
nRBC: 0 % (ref 0.0–0.2)

## 2020-01-05 LAB — URINALYSIS, COMPLETE (UACMP) WITH MICROSCOPIC
Bacteria, UA: NONE SEEN
Bilirubin Urine: NEGATIVE
Glucose, UA: NEGATIVE mg/dL
Ketones, ur: NEGATIVE mg/dL
Leukocytes,Ua: NEGATIVE
Nitrite: NEGATIVE
Protein, ur: NEGATIVE mg/dL
Specific Gravity, Urine: 1.009 (ref 1.005–1.030)
Squamous Epithelial / HPF: NONE SEEN (ref 0–5)
pH: 7 (ref 5.0–8.0)

## 2020-01-05 LAB — BASIC METABOLIC PANEL
Anion gap: 9 (ref 5–15)
BUN: 12 mg/dL (ref 6–20)
CO2: 27 mmol/L (ref 22–32)
Calcium: 9.6 mg/dL (ref 8.9–10.3)
Chloride: 102 mmol/L (ref 98–111)
Creatinine, Ser: 0.84 mg/dL (ref 0.61–1.24)
GFR calc Af Amer: >60 mL/min (ref >60)
GFR calc non Af Amer: >60 mL/min (ref >60)
Glucose, Bld: 101 mg/dL — ABNORMAL HIGH (ref 70–99)
Potassium: 3.7 mmol/L (ref 3.5–5.1)
Sodium: 138 mmol/L (ref 135–145)

## 2020-01-05 MED ORDER — ONDANSETRON 4 MG PO TBDP: 4.0000 mg | ORAL_TABLET | Freq: Three times a day (TID) | ORAL | 0 refills | Status: AC | PRN

## 2020-01-05 MED ORDER — CIPROFLOXACIN HCL 500 MG PO TABS: 500.0000 mg | ORAL_TABLET | Freq: Two times a day (BID) | ORAL | 0 refills | Status: DC

## 2020-01-05 MED ORDER — OXYCODONE-ACETAMINOPHEN 5-325 MG PO TABS: 1.0000 | ORAL_TABLET | ORAL | 0 refills | Status: AC | PRN

## 2020-01-05 MED ORDER — CIPROFLOXACIN HCL 500 MG PO TABS
500.0000 mg | ORAL_TABLET | Freq: Once | ORAL | Status: AC
  Administered 2020-01-05: 500 mg via ORAL
  Filled 2020-01-05: qty 1

## 2020-01-05 MED ORDER — METRONIDAZOLE 500 MG PO TABS
500.0000 mg | ORAL_TABLET | Freq: Once | ORAL | Status: AC
  Administered 2020-01-05: 03:00:00 500 mg via ORAL
  Filled 2020-01-05: qty 1

## 2020-01-05 MED ORDER — METRONIDAZOLE 500 MG PO TABS: 500.0000 mg | ORAL_TABLET | Freq: Two times a day (BID) | ORAL | 0 refills | Status: DC

## 2020-01-05 NOTE — ED Notes (Addendum)
Pt states having left lower back pain but denies n/v but does have diarrhea.

## 2020-01-05 NOTE — Discharge Instructions (Signed)
1.  Take antibiotics as prescribed: Cipro 500 mg twice daily x7 days Flagyl 500 mg twice daily x7 days 2.  You may take pain and nausea medicines as needed (Percocet/Zofran #20). 3.  Return to the ER for worsening symptoms, persistent vomiting, fever, difficulty breathing or other concerns.

## 2020-01-05 NOTE — ED Triage Notes (Signed)
Patient reports having left lower back/flank pain with urinary frequency starting earlier in the day.

## 2020-01-05 NOTE — ED Provider Notes (Signed)
[facility] Emergency Department Provider Note   ____________________________________________   First MD Initiated Contact with Patient 01/05/20 [phone]     (approximate)  I have reviewed the triage vital signs and the nursing notes.   HISTORY  Chief Complaint Flank Pain    HPI Austin Hicks is a 54 y.o. male who presents to the ED from home with a chief complaint of left flank and left lower quadrant abdominal pain.  Patient received the [facility] Covid 19 vaccination on Thursday.  By the next day he was experiencing left flank and abdominal pain.  Symptoms associated with urinary frequency.  Denies fever, cough, chest pain, shortness of breath, nausea, vomiting, hematuria or diarrhea.      Past Medical History:  Diagnosis Date  . GERD (gastroesophageal reflux disease)   . HLD (hyperlipidemia) 06/13/2018  . Hypertension   . Pre-diabetes 06/13/2018    Patient Active Problem List   Diagnosis Date Noted  . HLD (hyperlipidemia) 06/13/2018  . Pre-diabetes 06/13/2018    No past surgical history on file.  Prior to Admission medications   Medication Sig Start Date End Date Taking? Authorizing Provider  butalbital-acetaminophen-caffeine (FIORICET) 50-325-40 MG tablet Take 1 tablet by mouth every 4 (four) hours as needed for headache. 12/19/19   Irean Hong, MD  ciprofloxacin (CIPRO) 500 MG tablet Take 1 tablet (500 mg total) by mouth 2 (two) times daily. 01/05/20   Irean Hong, MD  metroNIDAZOLE (FLAGYL) 500 MG tablet Take 1 tablet (500 mg total) by mouth 2 (two) times daily. 01/05/20   Irean Hong, MD  ondansetron (ZOFRAN ODT) 4 MG disintegrating tablet Take 1 tablet (4 mg total) by mouth every 8 (eight) hours as needed for nausea or vomiting. 01/05/20   Irean Hong, MD  oxyCODONE-acetaminophen (PERCOCET/ROXICET) 5-325 MG tablet Take 1 tablet by mouth every 4 (four) hours as needed for severe pain. 01/05/20   Irean Hong, MD  pantoprazole  (PROTONIX) 40 MG tablet Take 1 tablet (40 mg total) by mouth daily. 11/18/19 11/17/20  Darci Current, MD  tamsulosin (FLOMAX) 0.4 MG CAPS capsule Take 1 capsule (0.4 mg total) by mouth daily after supper. 10/20/18   Willy Eddy, MD    Allergies Wasp venom  No family history on file.  Social History Social History   Tobacco Use  . Smoking status: Former Games developer  . Smokeless tobacco: Never Used  Substance Use Topics  . Alcohol use: Yes  . Drug use: No    Review of Systems  Constitutional: No fever/chills Eyes: No visual changes. ENT: No sore throat. Cardiovascular: Denies chest pain. Respiratory: Denies shortness of breath. Gastrointestinal: Positive for left flank and left lower quadrant abdominal pain.  No nausea, no vomiting.  No diarrhea.  No constipation. Genitourinary: Positive for frequency.  Negative for dysuria. Musculoskeletal: Negative for back pain. Skin: Negative for rash. Neurological: Negative for headaches, focal weakness or numbness.   ____________________________________________   PHYSICAL EXAM:  VITAL SIGNS: ED Triage Vitals [01/05/20 0041]  Enc Vitals Group     BP 126/79     Pulse Rate 69     Resp 17     Temp 97.9 F (36.6 C)     Temp src      SpO2 98 %     Weight 200 lb (90.7 kg)     Height 5\' 10"  (1.778 m)     Head Circumference      Peak Flow  Pain Score 8     Pain Loc      Pain Edu?      Excl. in GC?     Constitutional: Alert and oriented. Well appearing and in no acute distress. Eyes: Conjunctivae are normal. PERRL. EOMI. Head: Atraumatic. Nose: No congestion/rhinnorhea. Mouth/Throat: Mucous membranes are moist.  Oropharynx non-erythematous. Neck: No stridor.   Cardiovascular: Normal rate, regular rhythm. Grossly normal heart sounds.  Good peripheral circulation. Respiratory: Normal respiratory effort.  No retractions. Lungs CTAB. Gastrointestinal: Soft and minimally tender to palpation left lower quadrant without  rebound or guarding. No distention. No abdominal bruits.  Positive for mild left CVA tenderness. Musculoskeletal: No lower extremity tenderness nor edema.  No joint effusions. Neurologic:  Normal speech and language. No gross focal neurologic deficits are appreciated. No gait instability. Skin:  Skin is warm, dry and intact. No rash noted. Psychiatric: Mood and affect are normal. Speech and behavior are normal.  ____________________________________________   LABS (all labs ordered are listed, but only abnormal results are displayed)  Labs Reviewed  URINALYSIS, COMPLETE (UACMP) WITH MICROSCOPIC - Abnormal; Notable for the following components:      Result Value   Color, Urine STRAW (*)    APPearance CLEAR (*)    Hgb urine dipstick SMALL (*)    All other components within normal limits  BASIC METABOLIC PANEL - Abnormal; Notable for the following components:   Glucose, Bld 101 (*)    All other components within normal limits  CBC   ____________________________________________  EKG  None ____________________________________________  RADIOLOGY  ED MD interpretation: Diverticulitis without abscess or perforation; no ureteral stone  Official radiology report(s): CT Renal Stone Study  Result Date: 01/05/2020 CLINICAL DATA:  Flank pain left-sided EXAM: CT ABDOMEN AND PELVIS WITHOUT CONTRAST TECHNIQUE: Multidetector CT imaging of the abdomen and pelvis was performed following the standard protocol without IV contrast. COMPARISON:  CT 10/24/2018 FINDINGS: Lower chest: Lung bases demonstrate no acute consolidation or pleural effusion. Borderline heart size. Hepatobiliary: No focal liver abnormality is seen. No gallstones, gallbladder wall thickening, or biliary dilatation. Pancreas: Unremarkable. No pancreatic ductal dilatation or surrounding inflammatory changes. Spleen: Normal in size without focal abnormality. Adrenals/Urinary Tract: Adrenal glands are within normal limits. Kidneys show  no hydronephrosis. Vague hypodensity lower pole left kidney possibly a cyst but unable to further characterize without contrast. Mildly thick-walled urinary bladder. Stomach/Bowel: The stomach is nonenlarged. No dilated small bowel. Negative appendix. Diverticular disease of the descending and sigmoid colon. Trace inflammatory changes and fluid at the left lower quadrant/junction descending and proximal sigmoid colon. Vascular/Lymphatic: Mild aortic atherosclerosis. No aneurysm. No suspicious adenopathy Reproductive: Enlarged prostate Other: Negative for free air or free fluid. Small fat containing inguinal hernias Musculoskeletal: No acute or significant osseous findings. IMPRESSION: 1. Extensive diverticular disease of the descending and sigmoid colon. Suspicion of subtle inflammatory changes at the junction of the sigmoid colon and distal descending colon as may be seen with a mild diverticulitis. No perforation or abscess 2. Negative for kidney stone, hydronephrosis or ureteral stone 3. Prostatomegaly Electronically Signed   By: Jasmine Pang M.D.   On: 01/05/2020 02:27    ____________________________________________   PROCEDURES  Procedure(s) performed (including Critical Care):  Procedures   ____________________________________________   INITIAL IMPRESSION / ASSESSMENT AND PLAN / ED COURSE  As part of my medical decision making, I reviewed the following data within the electronic MEDICAL RECORD NUMBER Nursing notes reviewed and incorporated, Labs reviewed, Old chart reviewed, Radiograph reviewed, Notes from prior ED  visits and [place] Controlled Substance Database     Austin Hicks was evaluated in Emergency Department on 01/05/2020 for the symptoms described in the history of present illness. He was evaluated in the context of the global COVID-19 pandemic, which necessitated consideration that the patient might be at risk for infection with the SARS-CoV-2 virus that causes COVID-19. Institutional  protocols and algorithms that pertain to the evaluation of patients at risk for COVID-19 are in a state of rapid change based on information released by regulatory bodies including the CDC and federal and state organizations. These policies and algorithms were followed during the patient's care in the ED.    54 year old male presenting with left flank and lower quadrant abdominal pain. Differential diagnosis includes, but is not limited to, acute appendicitis, renal colic, testicular torsion, urinary tract infection/pyelonephritis, prostatitis,  epididymitis, diverticulitis, small bowel obstruction or ileus, colitis, abdominal aortic aneurysm, gastroenteritis, hernia, etc.  Laboratory and UA results unremarkable.  CT scan demonstrates mild diverticulitis without abscess or perforation.  Will start Cipro/Flagyl.  Discharged home with prescriptions for Percocet and Zofran to use as needed.  Patient will follow up closely with his PCP.  Strict return precautions given.  Patient verbalizes understanding and agrees with plan of care.      ____________________________________________   FINAL CLINICAL IMPRESSION(S) / ED DIAGNOSES  Final diagnoses:  Diverticulitis  Left flank pain  Left lower quadrant abdominal pain     ED Discharge Orders         Ordered    ciprofloxacin (CIPRO) 500 MG tablet  2 times daily     01/05/20 0252    metroNIDAZOLE (FLAGYL) 500 MG tablet  2 times daily     01/05/20 0252    oxyCODONE-acetaminophen (PERCOCET/ROXICET) 5-325 MG tablet  Every 4 hours PRN     01/05/20 0252    ondansetron (ZOFRAN ODT) 4 MG disintegrating tablet  Every 8 hours PRN     01/05/20 0252           Note:  This document was prepared using Dragon voice recognition software and may include unintentional dictation errors.   Paulette Blanch, MD 01/05/20 [phone]

## 2020-01-13 ENCOUNTER — Emergency Department
Admission: EM | Admit: 2020-01-13 | Discharge: 2020-01-13 | Disposition: A | Discharge status: Home / Self Care | Payer: 59 | Attending: Student in an Organized Health Care Education/Training Program | Admitting: Student in an Organized Health Care Education/Training Program

## 2020-01-13 ENCOUNTER — Other Ambulatory Visit: Payer: Self-pay

## 2020-01-13 ENCOUNTER — Encounter: Payer: Self-pay | Admitting: Emergency Medicine

## 2020-01-13 DIAGNOSIS — Z79899 Other long term (current) drug therapy: Secondary | ICD-10-CM | POA: Diagnosis not present

## 2020-01-13 DIAGNOSIS — Z87891 Personal history of nicotine dependence: Secondary | ICD-10-CM | POA: Diagnosis not present

## 2020-01-13 DIAGNOSIS — R55 Syncope and collapse: Secondary | ICD-10-CM | POA: Diagnosis present

## 2020-01-13 DIAGNOSIS — R197 Diarrhea, unspecified: Secondary | ICD-10-CM | POA: Insufficient documentation

## 2020-01-13 DIAGNOSIS — I10 Essential (primary) hypertension: Secondary | ICD-10-CM | POA: Diagnosis not present

## 2020-01-13 LAB — BASIC METABOLIC PANEL
Anion gap: 8 (ref 5–15)
BUN: 17 mg/dL (ref 6–20)
CO2: 25 mmol/L (ref 22–32)
Calcium: 9.5 mg/dL (ref 8.9–10.3)
Chloride: 103 mmol/L (ref 98–111)
Creatinine, Ser: 0.98 mg/dL (ref 0.61–1.24)
GFR calc Af Amer: >60 mL/min (ref >60)
GFR calc non Af Amer: >60 mL/min (ref >60)
Glucose, Bld: 114 mg/dL — ABNORMAL HIGH (ref 70–99)
Potassium: 3.9 mmol/L (ref 3.5–5.1)
Sodium: 136 mmol/L (ref 135–145)

## 2020-01-13 LAB — GASTROINTESTINAL PANEL BY PCR, STOOL (REPLACES STOOL CULTURE)

## 2020-01-13 LAB — CBC
HCT: 45 % (ref 39.0–52.0)
Hemoglobin: 14.9 g/dL (ref 13.0–17.0)
MCH: 28.4 pg (ref 26.0–34.0)
MCHC: 33.1 g/dL (ref 30.0–36.0)
MCV: 85.9 fL (ref 80.0–100.0)
Platelets: 360 10*3/uL (ref 150–400)
RBC: 5.24 MIL/uL (ref 4.22–5.81)
RDW: 13.4 % (ref 11.5–15.5)
WBC: 6.3 10*3/uL (ref 4.0–10.5)
nRBC: 0 % (ref 0.0–0.2)

## 2020-01-13 LAB — C DIFFICILE QUICK SCREEN W PCR REFLEX
C Diff antigen: NEGATIVE
C Diff interpretation: NOT DETECTED
C Diff toxin: NEGATIVE

## 2020-01-13 LAB — URINALYSIS, COMPLETE (UACMP) WITH MICROSCOPIC
Bacteria, UA: NONE SEEN
Bilirubin Urine: NEGATIVE
Glucose, UA: NEGATIVE mg/dL
Hgb urine dipstick: NEGATIVE
Ketones, ur: NEGATIVE mg/dL
Leukocytes,Ua: NEGATIVE
Nitrite: NEGATIVE
Protein, ur: NEGATIVE mg/dL
Specific Gravity, Urine: 1.018 (ref 1.005–1.030)
Squamous Epithelial / HPF: NONE SEEN (ref 0–5)
pH: 6 (ref 5.0–8.0)

## 2020-01-13 MED ORDER — DOXYCYCLINE HYCLATE 100 MG PO TABS: 100.0000 mg | ORAL_TABLET | Freq: Two times a day (BID) | ORAL | 0 refills | Status: AC

## 2020-01-13 MED ORDER — DOXYCYCLINE HYCLATE 100 MG PO TABS
100.0000 mg | ORAL_TABLET | Freq: Once | ORAL | Status: AC
  Administered 2020-01-13: 100 mg via ORAL
  Filled 2020-01-13: qty 1

## 2020-01-13 MED ORDER — ONDANSETRON 4 MG PO TBDP
4.0000 mg | ORAL_TABLET | Freq: Once | ORAL | Status: AC
  Administered 2020-01-13: 4 mg via ORAL
  Filled 2020-01-13: qty 1

## 2020-01-13 NOTE — ED Provider Notes (Signed)
[facility] Emergency Department Provider Note    First MD Initiated Contact with Patient 01/13/20 1828     (approximate)  I have reviewed the triage vital signs and the nursing notes.   HISTORY  Chief Complaint Near Syncope    HPI Austin Hicks is a 54 y.o. male with the below listed past medical history presents to the ER for evaluation of nausea and feeling lightheaded and like he is about to pass out every time he takes Augmentin.  Just had this medication recently switched from Cipro Flagyl which he was also unable to tolerate after he was diagnosed with possible diverticulitis roughly 7 days ago.  Was having left lower quadrant pain at that time.  States that he is not having any worsening pain.  No fevers.  Did not have any white count then.  States he is having some loose stools but it is not significant diarrhea.  He is otherwise eating and drinking okay.    Past Medical History:  Diagnosis Date  . GERD (gastroesophageal reflux disease)   . HLD (hyperlipidemia) 06/13/2018  . Hypertension   . Pre-diabetes 06/13/2018   History reviewed. No pertinent family history. History reviewed. No pertinent surgical history. Patient Active Problem List   Diagnosis Date Noted  . HLD (hyperlipidemia) 06/13/2018  . Pre-diabetes 06/13/2018      Prior to Admission medications   Medication Sig Start Date End Date Taking? Authorizing Provider  butalbital-acetaminophen-caffeine (FIORICET) 50-325-40 MG tablet Take 1 tablet by mouth every 4 (four) hours as needed for headache. 12/19/19   Irean Hong, MD  doxycycline (VIBRA-TABS) 100 MG tablet Take 1 tablet (100 mg total) by mouth 2 (two) times daily for 5 days. 01/13/20 01/18/20  Willy Eddy, MD  ondansetron (ZOFRAN ODT) 4 MG disintegrating tablet Take 1 tablet (4 mg total) by mouth every 8 (eight) hours as needed for nausea or vomiting. 01/05/20   Irean Hong, MD  oxyCODONE-acetaminophen (PERCOCET/ROXICET)  5-325 MG tablet Take 1 tablet by mouth every 4 (four) hours as needed for severe pain. 01/05/20   Irean Hong, MD  pantoprazole (PROTONIX) 40 MG tablet Take 1 tablet (40 mg total) by mouth daily. 11/18/19 11/17/20  Darci Current, MD  tamsulosin (FLOMAX) 0.4 MG CAPS capsule Take 1 capsule (0.4 mg total) by mouth daily after supper. 10/20/18   Willy Eddy, MD    Allergies Wasp venom    Social History Social History   Tobacco Use  . Smoking status: Former Games developer  . Smokeless tobacco: Never Used  Substance Use Topics  . Alcohol use: Yes  . Drug use: No    Review of Systems Patient denies headaches, rhinorrhea, blurry vision, numbness, shortness of breath, chest pain, edema, cough, abdominal pain, nausea, vomiting, diarrhea, dysuria, fevers, rashes or hallucinations unless otherwise stated above in HPI. ____________________________________________   PHYSICAL EXAM:  VITAL SIGNS: Vitals:   01/13/20 1703  BP: 125/75  Pulse: 87  Resp: 16  Temp: 98.4 F (36.9 C)  SpO2: 99%    Constitutional: Alert and oriented.  Eyes: Conjunctivae are normal.  Head: Atraumatic. Nose: No congestion/rhinnorhea. Mouth/Throat: Mucous membranes are moist.   Neck: No stridor. Painless ROM.  Cardiovascular: Normal rate, regular rhythm. Grossly normal heart sounds.  Good peripheral circulation. Respiratory: Normal respiratory effort.  No retractions. Lungs CTAB. Gastrointestinal: Soft and nontender in all four quadrants. No distention.   No CVA tenderness. Genitourinary:  Musculoskeletal: No lower extremity tenderness nor edema.  No joint  effusions. Neurologic:  Normal speech and language. No gross focal neurologic deficits are appreciated. No facial droop Skin:  Skin is warm, dry and intact. No rash noted. Psychiatric: Mood and affect are normal. Speech and behavior are normal.  ____________________________________________   LABS (all labs ordered are listed, but only abnormal results  are displayed)  Results for orders placed or performed during the hospital encounter of 01/13/20 (from the past 24 hour(s))  Basic metabolic panel     Status: Abnormal   Collection Time: 01/13/20  5:05 PM  Result Value Ref Range   Sodium 136 135 - 145 mmol/L   Potassium 3.9 3.5 - 5.1 mmol/L   Chloride 103 98 - 111 mmol/L   CO2 25 22 - 32 mmol/L   Glucose, Bld 114 (H) 70 - 99 mg/dL   BUN 17 6 - 20 mg/dL   Creatinine, Ser 0.98 0.61 - 1.24 mg/dL   Calcium 9.5 8.9 - 10.3 mg/dL   GFR calc non Af Amer >60 >60 mL/min   GFR calc Af Amer >60 >60 mL/min   Anion gap 8 5 - 15  CBC     Status: None   Collection Time: 01/13/20  5:05 PM  Result Value Ref Range   WBC 6.3 4.0 - 10.5 K/uL   RBC 5.24 4.22 - 5.81 MIL/uL   Hemoglobin 14.9 13.0 - 17.0 g/dL   HCT 45.0 39.0 - 52.0 %   MCV 85.9 80.0 - 100.0 fL   MCH 28.4 26.0 - 34.0 pg   MCHC 33.1 30.0 - 36.0 g/dL   RDW 13.4 11.5 - 15.5 %   Platelets 360 150 - 400 K/uL   nRBC 0.0 0.0 - 0.2 %  Urinalysis, Complete w Microscopic     Status: Abnormal   Collection Time: 01/13/20  5:05 PM  Result Value Ref Range   Color, Urine YELLOW (A) YELLOW   APPearance CLEAR (A) CLEAR   Specific Gravity, Urine 1.018 1.005 - 1.030   pH 6.0 5.0 - 8.0   Glucose, UA NEGATIVE NEGATIVE mg/dL   Hgb urine dipstick NEGATIVE NEGATIVE   Bilirubin Urine NEGATIVE NEGATIVE   Ketones, ur NEGATIVE NEGATIVE mg/dL   Protein, ur NEGATIVE NEGATIVE mg/dL   Nitrite NEGATIVE NEGATIVE   Leukocytes,Ua NEGATIVE NEGATIVE   RBC / HPF 0-5 0 - 5 RBC/hpf   WBC, UA 0-5 0 - 5 WBC/hpf   Bacteria, UA NONE SEEN NONE SEEN   Squamous Epithelial / LPF NONE SEEN 0 - 5   Mucus PRESENT    ____________________________________________  EKG____________________________________________  RADIOLOGY   ____________________________________________   PROCEDURES  Procedure(s) performed:  Procedures    Critical Care performed: no ____________________________________________   INITIAL  IMPRESSION / ASSESSMENT AND PLAN / ED COURSE  Pertinent labs & imaging results that were available during my care of the patient were reviewed by me and considered in my medical decision making (see chart for details).   DDX: Diverticulitis, dehydration, colitis, medication reaction  Austin Hicks is a 54 y.o. who presents to the ED with symptoms as described above.  Patient well-appearing in no acute distress he is afebrile hemodynamically stable.  His white count is normal.  He has a benign abdominal exam.  We discussed the need for possible repeat imaging the patient prefers to defer this stating that his pain is improved.  Have a lower suspicion for development of uncomplicated diverticulitis given absence of white count, fever and pain.  It seems like most of his symptoms are secondary  to intolerance of these antibiotics.  Will trial a dose of doxycycline to see if he is able to tolerate this better.  Clinical Course as of Jan 12 2042  Tue Jan 13, 2020  2017 Patient up ambulating in no distress.  He was able to provide a stool sample.  We will send for stool studies.  So that is more likely just nonspecific diarrhea related to antibiotics.  Possible C. difficile.  Not consistent with toxic megacolon or complicated diverticulitis at this time based on his exam and reassuring work-up.   [PR]  2041 Patient feels well and is tolerating doxycycline without issue.  Stool studies will be sent off at this point given his reassuring exam I do not feel that he needs to be observed any longer.  Discussed signs and symptoms for which he needs to return to the ER as well as will be given referral to GI.  Have discussed with the patient and available family all diagnostics and treatments performed thus far and all questions were answered to the best of my ability. The patient demonstrates understanding and agreement with plan.    [PR]    Clinical Course User Index [PR] Willy Eddy, MD    The  patient was evaluated in Emergency Department today for the symptoms described in the history of present illness. He/she was evaluated in the context of the global COVID-19 pandemic, which necessitated consideration that the patient might be at risk for infection with the SARS-CoV-2 virus that causes COVID-19. Institutional protocols and algorithms that pertain to the evaluation of patients at risk for COVID-19 are in a state of rapid change based on information released by regulatory bodies including the CDC and federal and state organizations. These policies and algorithms were followed during the patient's care in the ED.  As part of my medical decision making, I reviewed the following data within the electronic MEDICAL RECORD NUMBER Nursing notes reviewed and incorporated, Labs reviewed, notes from prior ED visits and [place] Controlled Substance Database   ____________________________________________   FINAL CLINICAL IMPRESSION(S) / ED DIAGNOSES  Final diagnoses:  Diarrhea, unspecified type      NEW MEDICATIONS STARTED DURING THIS VISIT:  New Prescriptions   DOXYCYCLINE (VIBRA-TABS) 100 MG TABLET    Take 1 tablet (100 mg total) by mouth 2 (two) times daily for 5 days.     Note:  This document was prepared using Dragon voice recognition software and may include unintentional dictation errors.    Willy Eddy, MD 01/13/20 2044

## 2020-01-13 NOTE — ED Notes (Signed)
Pt up to the restroom at this time.

## 2020-01-13 NOTE — Discharge Instructions (Signed)
Take doxycycline once in the morning once at night to complete your course of antibiotics.  Please be sure to drink plenty of fluids.  If you develop any fevers worsening pain or any additional symptoms please return to the ER.

## 2020-01-13 NOTE — ED Triage Notes (Signed)
Pt reports he was started on one medication for OM and switched off it because he was not tolerating it and put on augmentin. Every time he takes the augmentin he fees like he is going to pass out.  As time gets further away from when he took the med it gets a little better but then it is time to take the med again and he feels like going to pass out again from it.  NAD. VSS. Unlabored. Color WNL.

## 2020-01-17 ENCOUNTER — Emergency Department
Admission: EM | Admit: 2020-01-17 | Discharge: 2020-01-17 | Disposition: A | Discharge status: Home / Self Care | Payer: 59 | Attending: Emergency Medicine | Admitting: Emergency Medicine

## 2020-01-17 ENCOUNTER — Other Ambulatory Visit: Payer: Self-pay

## 2020-01-17 ENCOUNTER — Encounter: Payer: Self-pay | Admitting: Emergency Medicine

## 2020-01-17 ENCOUNTER — Emergency Department: Payer: 59

## 2020-01-17 DIAGNOSIS — Y999 Unspecified external cause status: Secondary | ICD-10-CM | POA: Insufficient documentation

## 2020-01-17 DIAGNOSIS — W1839XA Other fall on same level, initial encounter: Secondary | ICD-10-CM | POA: Insufficient documentation

## 2020-01-17 DIAGNOSIS — S0990XA Unspecified injury of head, initial encounter: Secondary | ICD-10-CM | POA: Diagnosis not present

## 2020-01-17 DIAGNOSIS — R42 Dizziness and giddiness: Secondary | ICD-10-CM | POA: Insufficient documentation

## 2020-01-17 DIAGNOSIS — Y9389 Activity, other specified: Secondary | ICD-10-CM | POA: Diagnosis not present

## 2020-01-17 DIAGNOSIS — R55 Syncope and collapse: Secondary | ICD-10-CM | POA: Insufficient documentation

## 2020-01-17 DIAGNOSIS — Y92512 Supermarket, store or market as the place of occurrence of the external cause: Secondary | ICD-10-CM | POA: Insufficient documentation

## 2020-01-17 DIAGNOSIS — I10 Essential (primary) hypertension: Secondary | ICD-10-CM | POA: Diagnosis not present

## 2020-01-17 LAB — CBC WITH DIFFERENTIAL/PLATELET
Abs Immature Granulocytes: 0.02 10*3/uL (ref 0.00–0.07)
Basophils Absolute: 0 10*3/uL (ref 0.0–0.1)
Basophils Relative: 0 %
Eosinophils Absolute: 0.1 10*3/uL (ref 0.0–0.5)
Eosinophils Relative: 2 %
HCT: 47.6 % (ref 39.0–52.0)
Hemoglobin: 15.4 g/dL (ref 13.0–17.0)
Immature Granulocytes: 0 %
Lymphocytes Relative: 31 %
Lymphs Abs: 1.6 10*3/uL (ref 0.7–4.0)
MCH: 28.5 pg (ref 26.0–34.0)
MCHC: 32.4 g/dL (ref 30.0–36.0)
MCV: 88 fL (ref 80.0–100.0)
Monocytes Absolute: 0.6 10*3/uL (ref 0.1–1.0)
Monocytes Relative: 12 %
Neutro Abs: 2.8 10*3/uL (ref 1.7–7.7)
Neutrophils Relative %: 55 %
Platelets: 380 10*3/uL (ref 150–400)
RBC: 5.41 MIL/uL (ref 4.22–5.81)
RDW: 13.5 % (ref 11.5–15.5)
WBC: 5.2 10*3/uL (ref 4.0–10.5)
nRBC: 0 % (ref 0.0–0.2)

## 2020-01-17 LAB — BASIC METABOLIC PANEL
Anion gap: 9 (ref 5–15)
BUN: 13 mg/dL (ref 6–20)
CO2: 27 mmol/L (ref 22–32)
Calcium: 9.6 mg/dL (ref 8.9–10.3)
Chloride: 101 mmol/L (ref 98–111)
Creatinine, Ser: 0.88 mg/dL (ref 0.61–1.24)
GFR calc Af Amer: >60 mL/min (ref >60)
GFR calc non Af Amer: >60 mL/min (ref >60)
Glucose, Bld: 125 mg/dL — ABNORMAL HIGH (ref 70–99)
Potassium: 4 mmol/L (ref 3.5–5.1)
Sodium: 137 mmol/L (ref 135–145)

## 2020-01-17 MED ORDER — LACTATED RINGERS IV BOLUS: 1000.0000 mL | Freq: Once | INTRAVENOUS | Status: DC

## 2020-01-17 MED ORDER — SODIUM CHLORIDE 0.9 % IV BOLUS
1000.0000 mL | Freq: Once | INTRAVENOUS | Status: AC
  Administered 2020-01-17: 1000 mL via INTRAVENOUS

## 2020-01-17 NOTE — ED Triage Notes (Signed)
Pt to ED via POV c/o headache that started this morning, having a blood taste in his mouth, and dizziness that has been going on for a while. Pt states that he is having numbness in his left hand that started around 0630. Pt states that this started after he woke up when he was "running to the bathroom". Pt has been having diarrhea since being on antibiotics for possibly diverticulitis. Pt is currently wearing a heart monitor on for chest pain. Pt is in NAD.

## 2020-01-17 NOTE — ED Triage Notes (Signed)
FIRST NURSE NOTE:  Pt here with family, reports hands numb and has taste of blood in his mouth, pt also c/o being dizzy.  Pt states he has a heart monitor on now.  Pt states "I think I need a head scan"

## 2020-01-17 NOTE — ED Notes (Signed)
Pt updated. Pending MD reassessment and disposition. Pt aware.  Questions answered at this time. Will continue to monitor.

## 2020-01-17 NOTE — ED Notes (Signed)
Pt and family updated per request.

## 2020-01-17 NOTE — ED Notes (Signed)
Pt reports fall on Tuesday. Pt hit his head when he fell, states that he has not felt right since he fell. Pt was not seen for fall.

## 2020-01-17 NOTE — ED Notes (Signed)
Pt up to use restroom.

## 2020-01-17 NOTE — ED Provider Notes (Signed)
[facility] Emergency Department Provider Note   ____________________________________________   First MD Initiated Contact with Patient 01/17/20 [phone]     (approximate)  I have reviewed the triage vital signs and the nursing notes.   HISTORY  Chief Complaint Dizziness and Headache    HPI Austin Hicks is a 54 y.o. male with past medical history of hypertension, hyperlipidemia, and GERD who presents to the ED complaining of lightheadedness.  Patient reports that he had a syncopal episode in [employer] 5 days ago, falling backwards and hitting his head.  He has not been feeling right since then with ongoing lightheadedness and feeling like he might pass out again.  He denies any chest pain or shortness of breath, has not noticed any fevers or cough.  He does state he has had ongoing diarrhea, but his PCP told him to stop taking the antibiotics he was previously prescribed.  He was seen by cardiology earlier this week and had Holter monitor placed.  He was concerned this morning due to onset of tingling in his right arm and states "I feel like I need a head scan".        Past Medical History:  Diagnosis Date  . GERD (gastroesophageal reflux disease)   . HLD (hyperlipidemia) 06/13/2018  . Hypertension   . Pre-diabetes 06/13/2018    Patient Active Problem List   Diagnosis Date Noted  . HLD (hyperlipidemia) 06/13/2018  . Pre-diabetes 06/13/2018    History reviewed. No pertinent surgical history.  Prior to Admission medications   Medication Sig Start Date End Date Taking? Authorizing Provider  butalbital-acetaminophen-caffeine (FIORICET) 50-325-40 MG tablet Take 1 tablet by mouth every 4 (four) hours as needed for headache. 12/19/19   Irean Hong, MD  doxycycline (VIBRA-TABS) 100 MG tablet Take 1 tablet (100 mg total) by mouth 2 (two) times daily for 5 days. 01/13/20 01/18/20  Willy Eddy, MD  ondansetron (ZOFRAN ODT) 4 MG disintegrating tablet Take 1  tablet (4 mg total) by mouth every 8 (eight) hours as needed for nausea or vomiting. 01/05/20   Irean Hong, MD  oxyCODONE-acetaminophen (PERCOCET/ROXICET) 5-325 MG tablet Take 1 tablet by mouth every 4 (four) hours as needed for severe pain. 01/05/20   Irean Hong, MD  pantoprazole (PROTONIX) 40 MG tablet Take 1 tablet (40 mg total) by mouth daily. 11/18/19 11/17/20  Darci Current, MD  tamsulosin (FLOMAX) 0.4 MG CAPS capsule Take 1 capsule (0.4 mg total) by mouth daily after supper. 10/20/18   Willy Eddy, MD    Allergies Wasp venom  No family history on file.  Social History Social History   Tobacco Use  . Smoking status: Former Games developer  . Smokeless tobacco: Never Used  Substance Use Topics  . Alcohol use: Yes  . Drug use: No    Review of Systems  Constitutional: No fever/chills.  Positive for lightheadedness. Eyes: No visual changes. ENT: No sore throat. Cardiovascular: Denies chest pain. Respiratory: Denies shortness of breath. Gastrointestinal: No abdominal pain.  No nausea, no vomiting.  Positive for diarrhea.  No constipation. Genitourinary: Negative for dysuria. Musculoskeletal: Negative for back pain. Skin: Negative for rash. Neurological: Negative for headaches, focal weakness or numbness.  Positive for right hand tingling.  ____________________________________________   PHYSICAL EXAM:  VITAL SIGNS: ED Triage Vitals  Enc Vitals Group     BP      Pulse      Resp      Temp  Temp src      SpO2      Weight      Height      Head Circumference      Peak Flow      Pain Score      Pain Loc      Pain Edu?      Excl. in [place]?     Constitutional: Alert and oriented. Eyes: Conjunctivae are normal. Head: Atraumatic. Nose: No congestion/rhinnorhea. Mouth/Throat: Mucous membranes are moist. Neck: Normal ROM Cardiovascular: Normal rate, regular rhythm. Grossly normal heart sounds. Respiratory: Normal respiratory effort.  No retractions. Lungs  CTAB. Gastrointestinal: Soft and nontender. No distention. Genitourinary: deferred Musculoskeletal: No lower extremity tenderness nor edema. Neurologic:  Normal speech and language. No gross focal neurologic deficits are appreciated. Skin:  Skin is warm, dry and intact. No rash noted. Psychiatric: Mood and affect are normal. Speech and behavior are normal.  ____________________________________________   LABS (all labs ordered are listed, but only abnormal results are displayed)  Labs Reviewed  BASIC METABOLIC PANEL - Abnormal; Notable for the following components:      Result Value   Glucose, Bld 125 (*)    All other components within normal limits  CBC WITH DIFFERENTIAL/PLATELET   ____________________________________________  EKG  ED ECG REPORT I, Blake Divine, the attending physician, personally viewed and interpreted this ECG.   Date: 01/17/2020  EKG Time: 8:40  Rate: 81  Rhythm: normal sinus rhythm  Axis: Normal  Intervals:none  ST&T Change: None   PROCEDURES  Procedure(s) performed (including Critical Care):  .1-3 Lead EKG Interpretation Performed by: Blake Divine, MD Authorized by: Blake Divine, MD     Interpretation: normal     ECG rate:  76   ECG rate assessment: normal     Rhythm: sinus rhythm     Ectopy: none     Conduction: normal       ____________________________________________   INITIAL IMPRESSION / ASSESSMENT AND PLAN / ED COURSE       54 year old male presents to the ED complaining of ongoing lightheadedness and feeling like he is going to pass out following episode of syncope 5 days ago where he also struck his head.  He developed some tingling in his right hand earlier this morning, however strength appears intact in all of his extremities and cranial nerves are also grossly intact.  We will further assess with CT scan given traumatic injury, but I doubt acute stroke.  EKG shows no evidence of arrhythmia or ischemia, recent  syncopal episode thought to be vasovagal on cardiology evaluation yesterday.  He has Holter monitor in place, we will screen labs and continue to watch on cardiac monitor, but if work-up is unremarkable he would be appropriate for discharge home and follow-up with PCP and cardiology.  CT head negative for acute process, lab work unremarkable, and patient remains in normal sinus rhythm on cardiac monitor.  He reports feeling much better following IV fluid bolus and at this time is appropriate for discharge home with PCP follow-up.  He was counseled to return to the ED for new worsening symptoms, patient agrees with plan.      ____________________________________________   FINAL CLINICAL IMPRESSION(S) / ED DIAGNOSES  Final diagnoses:  Lightheadedness  Near syncope     ED Discharge Orders    None       Note:  This document was prepared using Dragon voice recognition software and may include unintentional dictation errors.   Blake Divine, MD 01/17/20  1241  

## 2020-01-17 NOTE — ED Triage Notes (Signed)
ED at bedside.  

## 2020-01-19 ENCOUNTER — Ambulatory Visit: Payer: Self-pay | Admitting: *Deleted

## 2020-01-19 NOTE — Telephone Encounter (Signed)
Patient states that he went to [facility] today and was advised to take Pantoprazole 40mg  twice a day. Pt wanted to know if this was safe due to previously taking medication once a day. Pt advised that the medication was safe to take as prescribed. Patient states that he was advised to return in 3 weeks. Patient encouraged to return for follow up visit to make sure that increase in dosage helped with current symptoms. Understanding verbalized.   Reason for Disposition . Caller has medication question only, adult not sick, and triager answers question  Answer Assessment - Initial Assessment Questions 1.   NAME of MEDICATION: "What medicine are you calling about?"     Pantoprazole 2.   QUESTION: "What is your question?"     Is it safe to take 80mg  a day, patient was seen a [facility] today and was advised to increase dose 40 mg twice a day 3.   PRESCRIBING HCP: "Who prescribed it?" Reason: if prescribed by specialist, call should be referred to that group.     Provider at [facility] clinic 4. SYMPTOMS: "Do you have any symptoms?"     Acid reflux 5. SEVERITY: If symptoms are present, ask "Are they mild, moderate or severe?"     Not voiced 6.  PREGNANCY:  "Is there any chance that you are pregnant?" "When was your last menstrual period?"     n/a  Protocols used: MEDICATION QUESTION CALL-A-AH

## 2020-01-25 ENCOUNTER — Emergency Department
Admission: EM | Admit: 2020-01-25 | Discharge: 2020-01-25 | Disposition: A | Discharge status: Home / Self Care | Payer: 59 | Attending: Emergency Medicine | Admitting: Emergency Medicine

## 2020-01-25 ENCOUNTER — Other Ambulatory Visit: Payer: Self-pay

## 2020-01-25 DIAGNOSIS — I10 Essential (primary) hypertension: Secondary | ICD-10-CM | POA: Insufficient documentation

## 2020-01-25 DIAGNOSIS — Z87891 Personal history of nicotine dependence: Secondary | ICD-10-CM | POA: Insufficient documentation

## 2020-01-25 DIAGNOSIS — R042 Hemoptysis: Secondary | ICD-10-CM | POA: Diagnosis present

## 2020-01-25 DIAGNOSIS — K2971 Gastritis, unspecified, with bleeding: Secondary | ICD-10-CM | POA: Insufficient documentation

## 2020-01-25 DIAGNOSIS — Z79899 Other long term (current) drug therapy: Secondary | ICD-10-CM | POA: Insufficient documentation

## 2020-01-25 LAB — CBC WITH DIFFERENTIAL/PLATELET
Abs Immature Granulocytes: 0.02 10*3/uL (ref 0.00–0.07)
Basophils Absolute: 0 10*3/uL (ref 0.0–0.1)
Basophils Relative: 0 %
Eosinophils Absolute: 0.2 10*3/uL (ref 0.0–0.5)
Eosinophils Relative: 3 %
HCT: 43.9 % (ref 39.0–52.0)
Hemoglobin: 14.2 g/dL (ref 13.0–17.0)
Immature Granulocytes: 0 %
Lymphocytes Relative: 38 %
Lymphs Abs: 2.7 10*3/uL (ref 0.7–4.0)
MCH: 28.2 pg (ref 26.0–34.0)
MCHC: 32.3 g/dL (ref 30.0–36.0)
MCV: 87.3 fL (ref 80.0–100.0)
Monocytes Absolute: 0.8 10*3/uL (ref 0.1–1.0)
Monocytes Relative: 11 %
Neutro Abs: 3.2 10*3/uL (ref 1.7–7.7)
Neutrophils Relative %: 48 %
Platelets: 344 10*3/uL (ref 150–400)
RBC: 5.03 MIL/uL (ref 4.22–5.81)
RDW: 13.3 % (ref 11.5–15.5)
WBC: 7 10*3/uL (ref 4.0–10.5)
nRBC: 0 % (ref 0.0–0.2)

## 2020-01-25 LAB — BASIC METABOLIC PANEL
Anion gap: 7 (ref 5–15)
BUN: 17 mg/dL (ref 6–20)
CO2: 27 mmol/L (ref 22–32)
Calcium: 9.6 mg/dL (ref 8.9–10.3)
Chloride: 105 mmol/L (ref 98–111)
Creatinine, Ser: 0.86 mg/dL (ref 0.61–1.24)
GFR calc Af Amer: >60 mL/min (ref >60)
GFR calc non Af Amer: >60 mL/min (ref >60)
Glucose, Bld: 126 mg/dL — ABNORMAL HIGH (ref 70–99)
Potassium: 3.5 mmol/L (ref 3.5–5.1)
Sodium: 139 mmol/L (ref 135–145)

## 2020-01-25 MED ORDER — LIDOCAINE VISCOUS HCL 2 % MT SOLN
15.0000 mL | Freq: Once | OROMUCOSAL | Status: AC
  Administered 2020-01-25: 15 mL via OROMUCOSAL
  Filled 2020-01-25: qty 15

## 2020-01-25 MED ORDER — LIDOCAINE VISCOUS HCL 2 % MT SOLN: 15.0000 mL | OROMUCOSAL | 0 refills | Status: AC | PRN

## 2020-01-25 MED ORDER — SUCRALFATE 1 G PO TABS
1.0000 g | ORAL_TABLET | Freq: Four times a day (QID) | ORAL | 0 refills | Status: AC
Start: 2020-01-25 — End: ?

## 2020-01-25 NOTE — ED Notes (Signed)
Pt states "when I spit I spit up streaks of blood". Pt denies vomiting or coughing. Pt states does have a history of gastric reflux and HTN. Pt states has been having diarrhea. Pt appears in no acute distress.

## 2020-01-25 NOTE — ED Notes (Signed)
ED Provider at bedside. 

## 2020-01-25 NOTE — ED Triage Notes (Signed)
Patient reports over the past hour he has been "spitting" up blood.  When asked if vomiting states not like when he coughs.

## 2020-01-25 NOTE — ED Notes (Signed)
Report to kate, rn.  

## 2020-01-25 NOTE — Discharge Instructions (Addendum)
Please seek medical attention for any high fevers, chest pain, shortness of breath, change in behavior, persistent vomiting, bloody stool or any other new or concerning symptoms.  

## 2020-01-25 NOTE — ED Provider Notes (Signed)
[facility] Emergency Department Provider Note  ____________________________________________   I have reviewed the triage vital signs and the nursing notes.   HISTORY  Chief Complaint Spitting/Coughing up Blood   History limited by: Not Limited   HPI Austin Hicks is a 54 y.o. male who presents to the emergency department today because of concerns for spitting up blood.  The patient states his symptoms started yesterday.  He has noticed some streaks of blood in his saliva.  This has been accompanied by some upper abdominal pain.  The patient states that recently he was treated for diverticulitis.  That pain was more in the lower stomach but this pain is now more in the upper stomach.  Patient has also noticed some streaks of blood in his stool.  He denies any fevers.   Records reviewed. Per medical record review patient has a history of GERD, HLD, HTN.   Past Medical History:  Diagnosis Date  . GERD (gastroesophageal reflux disease)   . HLD (hyperlipidemia) 06/13/2018  . Hypertension   . Pre-diabetes 06/13/2018    Patient Active Problem List   Diagnosis Date Noted  . HLD (hyperlipidemia) 06/13/2018  . Pre-diabetes 06/13/2018    No past surgical history on file.  Prior to Admission medications   Medication Sig Start Date End Date Taking? Authorizing Provider  butalbital-acetaminophen-caffeine (FIORICET) 50-325-40 MG tablet Take 1 tablet by mouth every 4 (four) hours as needed for headache. 12/19/19   Paulette Blanch, MD  lidocaine (XYLOCAINE) 2 % solution Use as directed 15 mLs in the mouth or throat as needed for mouth pain. 01/25/20   Nance Pear, MD  ondansetron (ZOFRAN ODT) 4 MG disintegrating tablet Take 1 tablet (4 mg total) by mouth every 8 (eight) hours as needed for nausea or vomiting. 01/05/20   Paulette Blanch, MD  oxyCODONE-acetaminophen (PERCOCET/ROXICET) 5-325 MG tablet Take 1 tablet by mouth every 4 (four) hours as needed for severe pain.  01/05/20   Paulette Blanch, MD  pantoprazole (PROTONIX) 40 MG tablet Take 1 tablet (40 mg total) by mouth daily. 11/18/19 11/17/20  Gregor Hams, MD  sucralfate (CARAFATE) 1 g tablet Take 1 tablet (1 g total) by mouth 4 (four) times daily. 01/25/20   Nance Pear, MD  tamsulosin (FLOMAX) 0.4 MG CAPS capsule Take 1 capsule (0.4 mg total) by mouth daily after supper. 10/20/18   Merlyn Lot, MD    Allergies Wasp venom  No family history on file.  Social History Social History   Tobacco Use  . Smoking status: Former Research scientist (life sciences)  . Smokeless tobacco: Never Used  Substance Use Topics  . Alcohol use: Yes  . Drug use: No    Review of Systems Constitutional: No fever/chills Eyes: No visual changes. ENT: No sore throat. Cardiovascular: Denies chest pain. Respiratory: Denies shortness of breath. Gastrointestinal: Positive for abdominal pain. Bloody sputum, bloody stool. Genitourinary: Negative for dysuria. Musculoskeletal: Negative for back pain. Skin: Negative for rash. Neurological: Negative for headaches, focal weakness or numbness.  ____________________________________________   PHYSICAL EXAM:  VITAL SIGNS: ED Triage Vitals  Enc Vitals Group     BP 01/25/20 0539 124/79     Pulse Rate 01/25/20 0539 77     Resp 01/25/20 0539 19     Temp 01/25/20 0539 97.8 F (36.6 C)     Temp Source 01/25/20 0539 Oral     SpO2 01/25/20 0539 97 %     Weight 01/25/20 0312 195 lb (88.5 kg)  Height 01/25/20 0312 5\' 10"  (1.778 m)     Head Circumference --      Peak Flow --      Pain Score 01/25/20 0312 0   Constitutional: Alert and oriented.  Eyes: Conjunctivae are normal.  ENT      Head: Normocephalic and atraumatic.      Nose: No congestion/rhinnorhea.      Mouth/Throat: Mucous membranes are moist.      Neck: No stridor. Hematological/Lymphatic/Immunilogical: No cervical lymphadenopathy. Cardiovascular: Normal rate, regular rhythm.  No murmurs, rubs, or gallops.  Respiratory:  Normal respiratory effort without tachypnea nor retractions. Breath sounds are clear and equal bilaterally. No wheezes/rales/rhonchi. Gastrointestinal: Soft and minimally tender to palpation in the upper abdomen.  Genitourinary: Deferred Musculoskeletal: Normal range of motion in all extremities. No lower extremity edema. Neurologic:  Normal speech and language. No gross focal neurologic deficits are appreciated.  Skin:  Skin is warm, dry and intact. No rash noted. Psychiatric: Mood and affect are normal. Speech and behavior are normal. Patient exhibits appropriate insight and judgment.  ____________________________________________    LABS (pertinent positives/negatives)  CBC wbc 7.0, hgb 14.2, plt 344 BMP wnl except glu 126  ____________________________________________   EKG  None  ____________________________________________    RADIOLOGY  None  ____________________________________________   PROCEDURES  Procedures  ____________________________________________   INITIAL IMPRESSION / ASSESSMENT AND PLAN / ED COURSE  Pertinent labs & imaging results that were available during my care of the patient were reviewed by me and considered in my medical decision making (see chart for details).   Patient presented to the emergency department today because of concerns for bloody sputum as well as some abdominal pain and GI bleeding.  On exam patient did have tenderness to the upper abdomen.  Blood work without any concerning anemia patient is neither hypotensive nor tachycardic.  Patient was given viscous lidocaine and did improve his abdominal pain.  At this point I do think is reasonable for patient be discharged home.  Discussed with patient if the bleeding does not resolve he should follow-up with primary care or GI.   ____________________________________________   FINAL CLINICAL IMPRESSION(S) / ED DIAGNOSES  Final diagnoses:  Gastritis with hemorrhage, unspecified  chronicity, unspecified gastritis type     Note: This dictation was prepared with Dragon dictation. Any transcriptional errors that result from this process are unintentional     03/26/20, MD 01/25/20 [phone]

## 2020-01-25 NOTE — ED Notes (Signed)
Pt up to restroom.

## 2020-01-31 ENCOUNTER — Ambulatory Visit: Payer: 59

## 2020-04-02 ENCOUNTER — Other Ambulatory Visit: Payer: Self-pay | Admitting: Neurology

## 2020-04-02 DIAGNOSIS — G932 Benign intracranial hypertension: Secondary | ICD-10-CM

## 2020-04-05 ENCOUNTER — Ambulatory Visit
Admission: RE | Admit: 2020-04-05 | Discharge: 2020-04-05 | Disposition: A | Discharge status: Home / Self Care | Payer: 59 | Source: Ambulatory Visit | Attending: Neurology | Admitting: Neurology

## 2020-04-05 ENCOUNTER — Other Ambulatory Visit: Payer: Self-pay

## 2020-04-05 DIAGNOSIS — G932 Benign intracranial hypertension: Secondary | ICD-10-CM | POA: Diagnosis present

## 2020-04-05 LAB — CBC
HCT: 44.6 % (ref 39.0–52.0)
Hemoglobin: 14.7 g/dL (ref 13.0–17.0)
MCH: 28.5 pg (ref 26.0–34.0)
MCHC: 33 g/dL (ref 30.0–36.0)
MCV: 86.6 fL (ref 80.0–100.0)
Platelets: 346 10*3/uL (ref 150–400)
RBC: 5.15 MIL/uL (ref 4.22–5.81)
RDW: 13.6 % (ref 11.5–15.5)
WBC: 4.4 10*3/uL (ref 4.0–10.5)
nRBC: 0 % (ref 0.0–0.2)

## 2020-04-05 LAB — CSF CELL COUNT WITH DIFFERENTIAL
Eosinophils, CSF: 0 %
Lymphs, CSF: 90 %
Monocyte-Macrophage-Spinal Fluid: 10 %
Other Cells, CSF: 0
RBC Count, CSF: 2 /mm3 (ref 0–3)
Segmented Neutrophils-CSF: 0 %
Tube #: 3
WBC, CSF: 5 /mm3 (ref 0–5)

## 2020-04-05 LAB — GLUCOSE, RANDOM: Glucose, Bld: 110 mg/dL — ABNORMAL HIGH (ref 70–99)

## 2020-04-05 LAB — PROTIME-INR
INR: 1 (ref 0.8–1.2)
Prothrombin Time: 12.5 seconds (ref 11.4–15.2)

## 2020-04-05 LAB — GLUCOSE, CSF: Glucose, CSF: 70 mg/dL (ref 40–70)

## 2020-04-05 LAB — APTT: aPTT: 33 seconds (ref 24–36)

## 2020-04-05 LAB — PROTEIN, CSF: Total  Protein, CSF: 38 mg/dL (ref 15–45)

## 2020-04-05 MED ORDER — ACETAMINOPHEN 325 MG PO TABS
ORAL_TABLET | ORAL | Status: AC
  Filled 2020-04-05: qty 2

## 2020-04-05 MED ORDER — ACETAMINOPHEN 325 MG PO TABS
650.0000 mg | ORAL_TABLET | ORAL | Status: DC | PRN
  Administered 2020-04-05: 650 mg via ORAL

## 2020-04-05 NOTE — Progress Notes (Signed)
Patient return to room 22 in preop to await orders from radiology s/p lumbar puncture.  Patient lying flat in bed; no complaints of pain at this time.

## 2020-04-05 NOTE — Discharge Instructions (Signed)
Lumbar Puncture, Care After This sheet gives you information about how to care for yourself after your procedure. Your health care provider may also give you more specific instructions. If you have problems or questions, contact your health care provider. What can I expect after the procedure? After the procedure, it is common to have:  Mild discomfort or pain at the puncture site.  A mild headache that is relieved with pain medicines. Follow these instructions at home: Activity   Lie down flat or rest for as long as directed by your health care provider.  Return to your normal activities as told by your health care provider. Ask your health care provider what activities are safe for you.  Avoid lifting anything heavier than 10 lb (4.5 kg) for at least 12 hours after the procedure.  Do not drive for 24 hours if you were given a medicine to help you relax (sedative) during your procedure.  Do not drive or use heavy machinery while taking prescription pain medicine. Puncture site care  Remove or change your bandage (dressing) as told by your health care provider.  Check your puncture area every day for signs of infection. Check for: ? More pain. ? Redness or swelling. ? Fluid or blood leaking from the puncture site. ? Warmth. ? Pus or a bad smell. General instructions  Take over-the-counter and prescription medicines only as told by your health care provider.  Drink enough fluids to keep your urine clear or pale yellow. Your health care provider may recommend drinking caffeine to prevent a headache.  Keep all follow-up visits as told by your health care provider. This is important. Contact a health care provider if:  You have fever or chills.  You have nausea or vomiting.  You have a headache that lasts for more than 2 days or does not get better with medicine. Get help right away if:  You develop any of the following in your  legs: ? Weakness. ? Numbness. ? Tingling.  You are unable to control when you urinate or have a bowel movement (incontinence).  You have signs of infection around your puncture site, such as: ? More pain. ? Redness or swelling. ? Fluid or blood leakage. ? Warmth. ? Pus or a bad smell.  You are dizzy or you feel like you might faint.  You have a severe headache, especially when you sit or stand. Summary  A lumbar puncture is a procedure in which a small needle is inserted into the lower back to remove fluid that surrounds the brain and spinal cord.  After this procedure, it is common to have a headache and pain around the needle insertion area.  Lying flat, staying hydrated, and drinking caffeine can help prevent headaches.  Monitor your needle insertion site for signs of infection, including warmth, fluid, or more pain.  Get help right away if you develop leg weakness, leg numbness, incontinence, or severe headaches. This information is not intended to replace advice given to you by your health care provider. Make sure you discuss any questions you have with your health care provider. Document Revised: 10/25/2016 Document Reviewed: 10/25/2016 Elsevier Patient Education  2020 Elsevier Inc[employer]  FOLLOW UP WITH DR. [facility] [phone] AS INSTRUCTED

## 2020-04-07 ENCOUNTER — Emergency Department: Payer: 59

## 2020-04-07 ENCOUNTER — Emergency Department
Admission: EM | Admit: 2020-04-07 | Discharge: 2020-04-08 | Disposition: A | Discharge status: Home / Self Care | Payer: 59 | Attending: Emergency Medicine | Admitting: Emergency Medicine

## 2020-04-07 ENCOUNTER — Ambulatory Visit: Payer: Self-pay | Admitting: *Deleted

## 2020-04-07 ENCOUNTER — Other Ambulatory Visit: Payer: Self-pay

## 2020-04-07 DIAGNOSIS — Z87891 Personal history of nicotine dependence: Secondary | ICD-10-CM | POA: Diagnosis not present

## 2020-04-07 DIAGNOSIS — R519 Headache, unspecified: Secondary | ICD-10-CM | POA: Insufficient documentation

## 2020-04-07 DIAGNOSIS — I10 Essential (primary) hypertension: Secondary | ICD-10-CM | POA: Diagnosis not present

## 2020-04-07 DIAGNOSIS — R202 Paresthesia of skin: Secondary | ICD-10-CM | POA: Diagnosis not present

## 2020-04-07 DIAGNOSIS — R0789 Other chest pain: Secondary | ICD-10-CM | POA: Insufficient documentation

## 2020-04-07 DIAGNOSIS — R7303 Prediabetes: Secondary | ICD-10-CM | POA: Insufficient documentation

## 2020-04-07 DIAGNOSIS — E785 Hyperlipidemia, unspecified: Secondary | ICD-10-CM | POA: Insufficient documentation

## 2020-04-07 DIAGNOSIS — R197 Diarrhea, unspecified: Secondary | ICD-10-CM | POA: Diagnosis not present

## 2020-04-07 DIAGNOSIS — Z79899 Other long term (current) drug therapy: Secondary | ICD-10-CM | POA: Insufficient documentation

## 2020-04-07 DIAGNOSIS — R350 Frequency of micturition: Secondary | ICD-10-CM | POA: Diagnosis not present

## 2020-04-07 DIAGNOSIS — R079 Chest pain, unspecified: Secondary | ICD-10-CM

## 2020-04-07 LAB — CBC
HCT: 43 % (ref 39.0–52.0)
Hemoglobin: 14 g/dL (ref 13.0–17.0)
MCH: 28.1 pg (ref 26.0–34.0)
MCHC: 32.6 g/dL (ref 30.0–36.0)
MCV: 86.3 fL (ref 80.0–100.0)
Platelets: 344 10*3/uL (ref 150–400)
RBC: 4.98 MIL/uL (ref 4.22–5.81)
RDW: 13.5 % (ref 11.5–15.5)
WBC: 5.1 10*3/uL (ref 4.0–10.5)
nRBC: 0 % (ref 0.0–0.2)

## 2020-04-07 LAB — BASIC METABOLIC PANEL
Anion gap: 8 (ref 5–15)
BUN: 8 mg/dL (ref 6–20)
CO2: 28 mmol/L (ref 22–32)
Calcium: 9.4 mg/dL (ref 8.9–10.3)
Chloride: 100 mmol/L (ref 98–111)
Creatinine, Ser: 0.96 mg/dL (ref 0.61–1.24)
GFR calc Af Amer: >60 mL/min (ref >60)
GFR calc non Af Amer: >60 mL/min (ref >60)
Glucose, Bld: 105 mg/dL — ABNORMAL HIGH (ref 70–99)
Potassium: 3.8 mmol/L (ref 3.5–5.1)
Sodium: 136 mmol/L (ref 135–145)

## 2020-04-07 LAB — IGG CSF INDEX
Albumin CSF-mCnc: 19 mg/dL (ref 15–55)
Albumin: 4.2 g/dL (ref 3.8–4.9)
CSF IgG Index: 0.5 (ref 0.0–0.7)
IgG (Immunoglobin G), Serum: 1194 mg/dL (ref 603–1613)
IgG, CSF: 2.9 mg/dL (ref 0.0–10.3)
IgG/Alb Ratio, CSF: 0.15 (ref 0.00–0.25)

## 2020-04-07 LAB — TROPONIN I (HIGH SENSITIVITY)
Troponin I (High Sensitivity): <2 ng/L (ref <18)
Troponin I (High Sensitivity): <2 ng/L (ref <18)

## 2020-04-07 NOTE — ED Triage Notes (Signed)
Pt arrives to ED via POV from home with c/o chest pain, SHOB, and left arm numbness x2 hrs. Pt reports recent lumbar puncture procedure for headache. Pt states chest pain in centrally located with radiation into the neck. No c/o N/V/D or fever. Pt is A&O, in NAD; RR even, regular, and unlabored.

## 2020-04-07 NOTE — ED Provider Notes (Signed)
[facility] Emergency Department Provider Note  ____________________________________________   First MD Initiated Contact with Patient 04/07/20 2335     (approximate)  I have reviewed the triage vital signs and the nursing notes.   HISTORY  Chief Complaint Chest Pain and Numbness    HPI Austin Hicks is a 54 y.o. male with hypertension, hyperlipidemia who comes in for multiple symptoms.  Patient reports having a headache that was worse a little bit than his normal this morning but did not seem to have resolved.  He now has his baseline headache.  He states that he then started developing chest pain that was severe, constant, middle of chest, nothing made it better, nothing made it worse.  He reported left arm tingling associated with it.  The arm tingling was over his entire arm.  He states that the tingling has gotten better and is now just in his hand.  He reports some abdominal discomfort yesterday but has now resolved has had CT imaging of his abdomen that is been normal.  He reported having multiple episodes of diarrhea that was a different consistency than normal but this has since resolved.  He reports some frequent urination.  Patient had a lumbar puncture on 7/12, 2 days ago.  Opening pressure was normal.  It was done with radiology.          Past Medical History:  Diagnosis Date  . GERD (gastroesophageal reflux disease)   . HLD (hyperlipidemia) 06/13/2018  . Hypertension   . Pre-diabetes 06/13/2018    Patient Active Problem List   Diagnosis Date Noted  . HLD (hyperlipidemia) 06/13/2018  . Pre-diabetes 06/13/2018    History reviewed. No pertinent surgical history.  Prior to Admission medications   Medication Sig Start Date End Date Taking? Authorizing Provider  butalbital-acetaminophen-caffeine (FIORICET) 50-325-40 MG tablet Take 1 tablet by mouth every 4 (four) hours as needed for headache. 12/19/19   Irean Hong, MD  lidocaine  (XYLOCAINE) 2 % solution Use as directed 15 mLs in the mouth or throat as needed for mouth pain. 01/25/20   Phineas Semen, MD  ondansetron (ZOFRAN ODT) 4 MG disintegrating tablet Take 1 tablet (4 mg total) by mouth every 8 (eight) hours as needed for nausea or vomiting. 01/05/20   Irean Hong, MD  oxyCODONE-acetaminophen (PERCOCET/ROXICET) 5-325 MG tablet Take 1 tablet by mouth every 4 (four) hours as needed for severe pain. 01/05/20   Irean Hong, MD  pantoprazole (PROTONIX) 40 MG tablet Take 1 tablet (40 mg total) by mouth daily. 11/18/19 11/17/20  Darci Current, MD  sucralfate (CARAFATE) 1 g tablet Take 1 tablet (1 g total) by mouth 4 (four) times daily. 01/25/20   Phineas Semen, MD  tamsulosin (FLOMAX) 0.4 MG CAPS capsule Take 1 capsule (0.4 mg total) by mouth daily after supper. 10/20/18   Willy Eddy, MD    Allergies Wasp venom  No family history on file.  Social History Social History   Tobacco Use  . Smoking status: Former Games developer  . Smokeless tobacco: Never Used  Substance Use Topics  . Alcohol use: Yes  . Drug use: No      Review of Systems Constitutional: No fever/chills Eyes: No visual changes. ENT: No sore throat. Cardiovascular: Positive chest pain Respiratory: Denies shortness of breath. Gastrointestinal: Abdominal pain, diarrhea, now resolved Genitourinary: Negative for dysuria.  Frequent urination Musculoskeletal: Negative for back pain. Skin: Negative for rash. Neurological: Tingling in the left arm without any weakness  All other ROS negative ____________________________________________   PHYSICAL EXAM:  VITAL SIGNS: ED Triage Vitals  Enc Vitals Group     BP 04/07/20 1904 125/82     Pulse Rate 04/07/20 1904 77     Resp 04/07/20 1904 16     Temp 04/07/20 1904 97.6 F (36.4 C)     Temp Source 04/07/20 1904 Oral     SpO2 04/07/20 1904 98 %     Weight 04/07/20 1902 191 lb (86.6 kg)     Height 04/07/20 1902 5\' 10"  (1.778 m)     Head  Circumference --      Peak Flow --      Pain Score 04/07/20 1902 6     Pain Loc --      Pain Edu? --      Excl. in GC? --     Constitutional: Alert and oriented. Well appearing and in no acute distress. Eyes: Conjunctivae are normal. EOMI. Head: Atraumatic. Nose: No congestion/rhinnorhea. Mouth/Throat: Mucous membranes are moist.   Neck: No stridor. Trachea Midline. FROM Cardiovascular: Normal rate, regular rhythm. Grossly normal heart sounds.  Good peripheral circulation. Respiratory: Normal respiratory effort.  No retractions. Lungs CTAB. Gastrointestinal: Soft and nontender. No distention. No abdominal bruits.  Musculoskeletal: No lower extremity tenderness nor edema.  No joint effusions. Neurologic:  Normal speech and language.  Cranial nerves II through XII are intact.  Equal strength in arms and legs.  Reports some sensation changes in the palmar surface of his hand in all 4 digits other than the thumb. Skin:  Skin is warm, dry and intact. No rash noted. Psychiatric: Mood and affect are normal. Speech and behavior are normal. GU: Deferred  Back: Lumbar puncture site without any erythema, warmth, redness, tenderness ____________________________________________   LABS (all labs ordered are listed, but only abnormal results are displayed)  Labs Reviewed  BASIC METABOLIC PANEL - Abnormal; Notable for the following components:      Result Value   Glucose, Bld 105 (*)    All other components within normal limits  URINALYSIS, COMPLETE (UACMP) WITH MICROSCOPIC - Abnormal; Notable for the following components:   Color, Urine STRAW (*)    APPearance CLEAR (*)    Specific Gravity, Urine 1.004 (*)    All other components within normal limits  CBC  HEPATIC FUNCTION PANEL  LIPASE, BLOOD  TROPONIN I (HIGH SENSITIVITY)  TROPONIN I (HIGH SENSITIVITY)   ____________________________________________   ED ECG REPORT I, Concha SeMary E Davyd Podgorski, the attending physician, personally viewed and  interpreted this ECG.  Normal sinus rate 79, no ST elevation, T wave inversion in lead III normal intervals ____________________________________________  RADIOLOGY Vela ProseI, Taiwo Fish E Lihanna Biever, personally viewed and evaluated these images (plain radiographs) as part of my medical decision making, as well as reviewing the written report by the radiologist.  ED MD interpretation: No pneumonia  Official radiology report(s): DG Chest 2 View  Result Date: 04/07/2020 CLINICAL DATA:  Chest pain, short of breath, left arm numbness for 2 hours EXAM: CHEST - 2 VIEW COMPARISON:  12/18/2019 FINDINGS: The heart size and mediastinal contours are within normal limits. Both lungs are clear. The visualized skeletal structures are unremarkable. IMPRESSION: No active cardiopulmonary disease. Electronically Signed   By: Sharlet SalinaMichael  Brown M.D.   On: 04/07/2020 19:20    ____________________________________________   PROCEDURES  Procedure(s) performed (including Critical Care):  Procedures   ____________________________________________   INITIAL IMPRESSION / ASSESSMENT AND PLAN / ED COURSE   Austin Hicks was evaluated  in Emergency Department on 04/07/2020 for the symptoms described in the history of present illness. He was evaluated in the context of the global COVID-19 pandemic, which necessitated consideration that the patient might be at risk for infection with the SARS-CoV-2 virus that causes COVID-19. Institutional protocols and algorithms that pertain to the evaluation of patients at risk for COVID-19 are in a state of rapid change based on information released by regulatory bodies including the CDC and federal and state organizations. These policies and algorithms were followed during the patient's care in the ED.    Most Likely DDx:  -Unclear the exact precipitant of patient's symptoms.  EKG and cardiac markers were obtained to evaluate for ACS.  Will get CT dissection to make sure no evidence of dissection  given the chest pain associated with arm numbness although arms and legs got good distal pulses throughout.  I have low suspicion for stroke but patient does report it was his entire arm that was numb and he still having the numbness in the hand.  Seems probably more peripheral in nature but will get MRI brain to make sure no evidence of mass, stroke.  I do not think this was related to the lumbar puncture given explained to patient that that would affect sensation more in his legs than in his hand.  He has baseline headaches and they do not seem to get worse with positions to suggest CSF leak.  The site had no evidence of cellulitis on it.  He is reporting frequent urination but the bladder scan did not show significant retention to suggest an issue from the lumbar puncture.  Will get UA to make sure no evidence of UTI  Abdomen is soft and nontender at this time I have lower suspicion for acute abdominal process.  DDx that was also considered d/t potential to cause harm, but was found less likely based on history and physical (as detailed above): -PNA (no fevers, cough but CXR to evaluate) -PNX (reassured with equal b/l breath sounds, CXR to evaluate) -Symptomatic anemia (will get H&H) -Pulmonary embolism as no sob at rest, not pleuritic in nature, no hypoxia -Pericarditis no rub on exam, EKG changes or hx to suggest dx -Tamponade (no notable SOB, tachycardic, hypotensive) -Esophageal rupture (no h/o diffuse vomitting/no crepitus)  Cardiac markers negative x2.  EKG was reassuring.  UA evidence of UTI.  Labs are all reassuring.  CT dissection is negative  2:22 AM patient is requesting a work note for 1 week.  To note patient is already been out for over a week secondary to these headaches per patient.  I discussed with patient that I cannot write a work note for that long given his work-up was negative I can only write it for tomorrow given he was here at 2 AM in the morning   MRI  negative  4:14 AM reevaluated patient.  Updated on all results.  Patient is doing well other than a little bit of tingling in his hands still.  Suspect is more likely peripheral in nature given negative MRI and is only in the palmar aspect of his hand.  We discussed following up with neurology.  Patient felt comfortable with this plan will be discharged home  I discussed the provisional nature of ED diagnosis, the treatment so far, the ongoing plan of care, follow up appointments and return precautions with the patient and any family or support people present. They expressed understanding and agreed with the plan, discharged home.   ____________________________________________  FINAL CLINICAL IMPRESSION(S) / ED DIAGNOSES   Final diagnoses:  Chest pain, unspecified type  Tingling     MEDICATIONS GIVEN DURING THIS VISIT:  Medications  iohexol (OMNIPAQUE) 350 MG/ML injection 100 mL (100 mLs Intravenous Contrast Given 04/08/20 0100)     ED Discharge Orders    None       Note:  This document was prepared using Dragon voice recognition software and may include unintentional dictation errors.   Concha Se, MD 04/08/20 [phone]

## 2020-04-07 NOTE — Telephone Encounter (Signed)
Pt reports had LP MOnday "Headaches, pressure on my brain." Reports tingling left hand and arm, onset 1 hour ago. Reports numbness, states has ROM and grasp. Also reports "Peeing every 2 minutes." Reports pressure at bladder area. Advised UC, pt states he will follow disposition, pts mother to transport.  Reason for Disposition . [1] Numbness (i.e., loss of sensation) of the face, arm / hand, or leg / foot on one side of the body AND [2] gradual onset (e.g., days to weeks) AND [3] present now    Had LP Monday  Answer Assessment - Initial Assessment Questions 1. SYMPTOM: "What is the main symptom you are concerned about?" (e.g., weakness, numbness)    Tingling and numbness in left arm and hand 2. ONSET: "When did this start?" (minutes, hours, days; while sleeping)     1 1/2 hours ago 3. LAST NORMAL: "When was the last time you were normal (no symptoms)?"     Pt had lumbar puncture Monday 4. PATTERN "Does this come and go, or has it been constant since it started?"  "Is it present now?"     constant 5. CARDIAC SYMPTOMS: "Have you had any of the following symptoms: chest pain, difficulty breathing, palpitations?"     NO 6. NEUROLOGIC SYMPTOMS: "Have you had any of the following symptoms: headache, dizziness, vision loss, double vision, changes in speech, unsteady on your feet?"     Occasional dizziness 7. OTHER SYMPTOMS: "Do you have any other symptoms?"     "Peeing every 2 minutes"  Pressure  At bladder  Protocols used: NEUROLOGIC DEFICIT-A-AH

## 2020-04-08 ENCOUNTER — Encounter: Payer: Self-pay | Admitting: Radiology

## 2020-04-08 ENCOUNTER — Other Ambulatory Visit: Payer: Self-pay | Admitting: *Deleted

## 2020-04-08 ENCOUNTER — Emergency Department: Payer: 59

## 2020-04-08 ENCOUNTER — Encounter: Payer: Self-pay | Admitting: *Deleted

## 2020-04-08 LAB — URINALYSIS, COMPLETE (UACMP) WITH MICROSCOPIC
Bacteria, UA: NONE SEEN
Bilirubin Urine: NEGATIVE
Glucose, UA: NEGATIVE mg/dL
Hgb urine dipstick: NEGATIVE
Ketones, ur: NEGATIVE mg/dL
Leukocytes,Ua: NEGATIVE
Nitrite: NEGATIVE
Protein, ur: NEGATIVE mg/dL
Specific Gravity, Urine: 1.004 — ABNORMAL LOW (ref 1.005–1.030)
Squamous Epithelial / HPF: NONE SEEN (ref 0–5)
pH: 7 (ref 5.0–8.0)

## 2020-04-08 LAB — HEPATIC FUNCTION PANEL
ALT: 31 U/L (ref 0–44)
AST: 26 U/L (ref 15–41)
Albumin: 4.2 g/dL (ref 3.5–5.0)
Alkaline Phosphatase: 94 U/L (ref 38–126)
Bilirubin, Direct: 0.1 mg/dL (ref 0.0–0.2)
Indirect Bilirubin: 0.6 mg/dL (ref 0.3–0.9)
Total Bilirubin: 0.7 mg/dL (ref 0.3–1.2)
Total Protein: 7.6 g/dL (ref 6.5–8.1)

## 2020-04-08 LAB — OLIGOCLONAL BANDS, CSF + SERM

## 2020-04-08 LAB — LIPASE, BLOOD: Lipase: 25 U/L (ref 11–51)

## 2020-04-08 MED ORDER — IOHEXOL 350 MG/ML SOLN
100.0000 mL | Freq: Once | INTRAVENOUS | Status: AC | PRN
  Administered 2020-04-08: 100 mL via INTRAVENOUS

## 2020-04-08 NOTE — ED Notes (Signed)
Pt to MRI

## 2020-04-08 NOTE — Discharge Instructions (Addendum)
No signs of heart attack, no signs of dissection, MRI negative for stroke.  You can talk to the neurology team for follow-up.  Return to the ER if you develop worsening chest pain, shortness of breath, weakness of the arm, slurred speech or any other concerns  IMPRESSION:  1. No acute intracranial abnormality.  2. Mild cerebral white matter disease, nonspecific, but most  commonly related to chronic small vessel ischemic disease.

## 2020-04-08 NOTE — Patient Outreach (Signed)
[facility]) Care Management [facility] CM Telephone Outreach, insurance referral, new patient  04/08/2020  Austin Hicks 1966/02/28 [id number]  Successful telephone outreach to [name], 54 y/o male referred to [facility] CM on insurance referral for multiple ED visits.  Patient has had 12-plus ED visits since January 2021 for varying reasons; patient has not experienced any inpatient hospitalizations.  Patient has history including, but not limited to, GERD; HTN; HLD.  HIPAA/ identity verified and purpose of call/ [facility] CM services discussed with patient today; patient initially agrees to participation in [facility] CM program, however, as call progressed, he appears to become quite agitated and asks "why are you asking me all these questions, who are you, and why are you calling me?"  Purpose of call/ referral source/ [facility] CM services again slowly explained to patient.  Patient reports he "has no needs other than to go to the orthopedic doctor;" he confirms that he visited with his "[facility] doctor" today who has placed a referral to orthopedic provider; he also confirms that he has an appointment to see his PCP tomorrow.  Confirms that he drives self to all provider appointments.  Patient loudly reports throughout call today that "all he needs" is to see an orthopedic doctor, and he does not appear able to grasp concept of [facility] CM program despite my frequent explanations; I explained that [facility] CM does not have any orthopedic providers and that [facility] CM program does include doctor office care for specialty or PCP.  Patient repeatedly states, "everything is under control expect this tingling in my hands."  He continually denies care coordination/ disease management needs.  Patient's phone ended up hanging up on me several times during this encounter; I contacted him back immediately with each incident, yet he states that he believes I am hanging up on him and asks why; I explained to patient that I re-contacted  him back after the call was spontaneously ended for unknown reason, however, he does not seem to be satisfied with my response.  Eventually, he agrees to think about his desire to particpate in [facility] CM program after receiving follow up letter in mail, but at time of our conversation completion, he declines ongoing [facility] CM participation  Plan:  Will make patient inactive with [facility] CM and send patient follow up letter, should he wish to participate in the future   Caryl Pina, RN, BSN, CCRN Alumnus [facility] Coordinator [facility] Care Management  [phone]

## 2020-04-09 LAB — HSV(HERPES SMPLX VRS)ABS-I+II(IGG)-CSF: HSV Type I/II Ab, IgG CSF: 0.52 IV (ref <=0.89)

## 2020-08-13 IMAGING — CT CT HEAD W/O CM
3 series · 15 of 47 positions shown, 18 images · non-contrast
Comparison: 12/19/2019 and prior CTs

CLINICAL DATA: 53-year-old male with head trauma 4 days ago and
acute LEFT hand numbness today.

EXAM:
CT HEAD WITHOUT CONTRAST
TECHNIQUE: Contiguous axial images were obtained from the base of the skull
through the vertex without intravenous contrast.

[Series 2: head wo · axial · 0.46mm/px · z∈[-127,+3]mm · 9 of 32 slices shown, 12 images]
[im 3/32  brain]
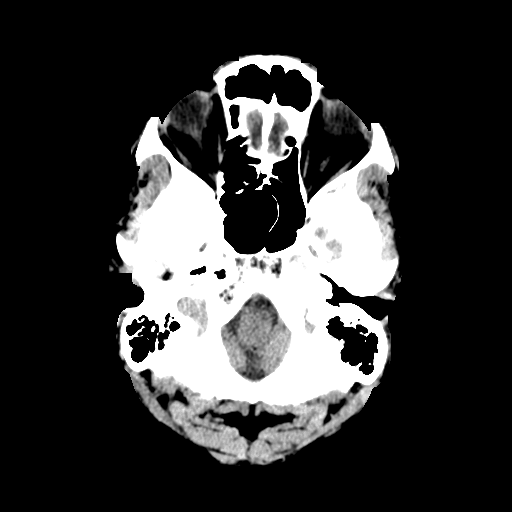
[im 3/32  bone]
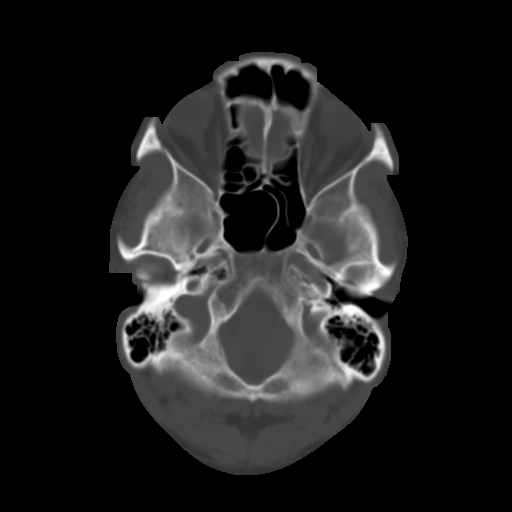
[im 6/32  brain]
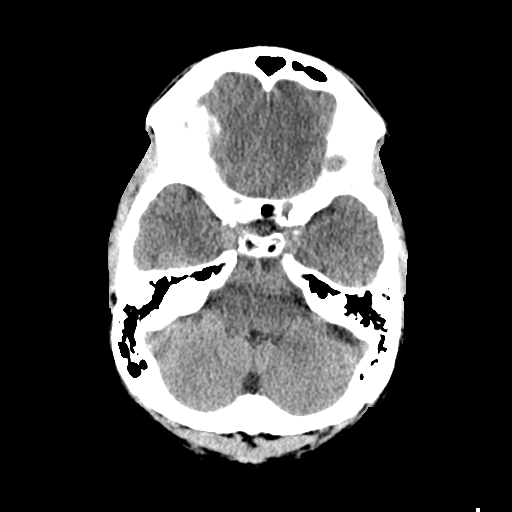
[im 9/32  brain]
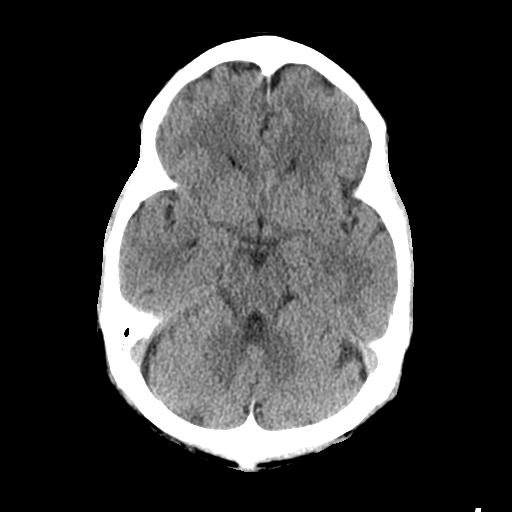
[im 12/32  brain]
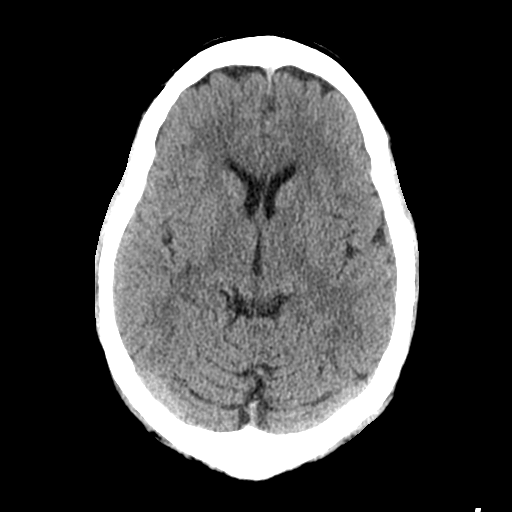
[im 17/32  brain]
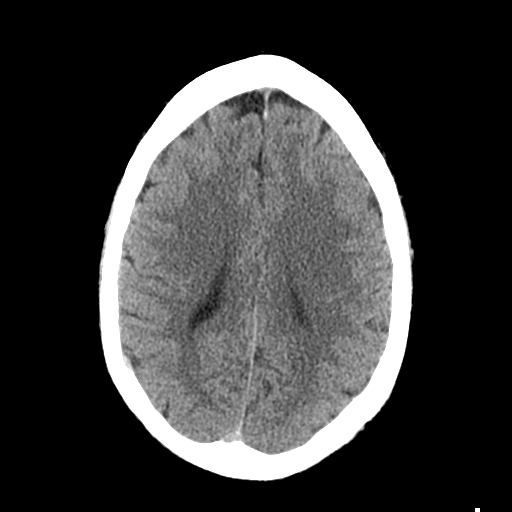
[im 17/32  bone]
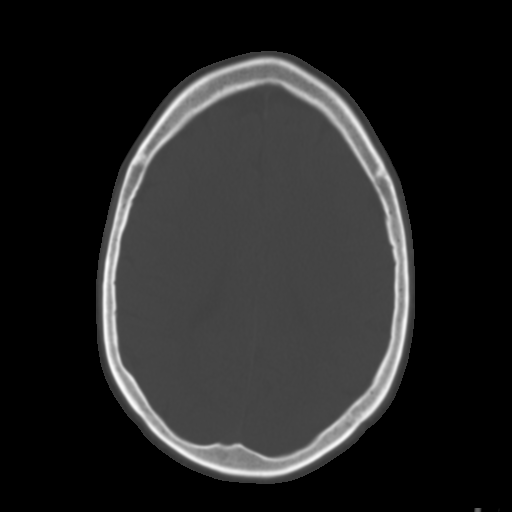
[im 20/32  brain]
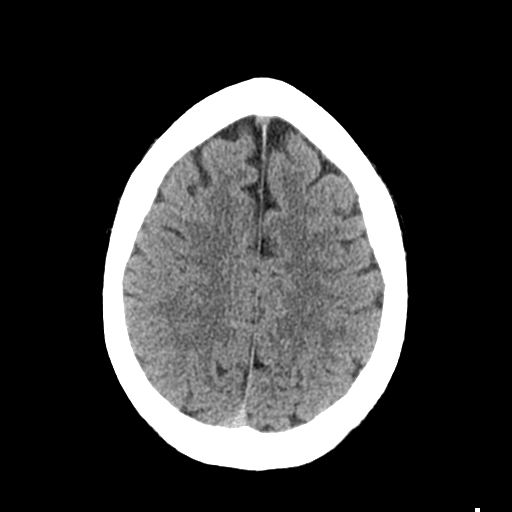
[im 23/32  brain]
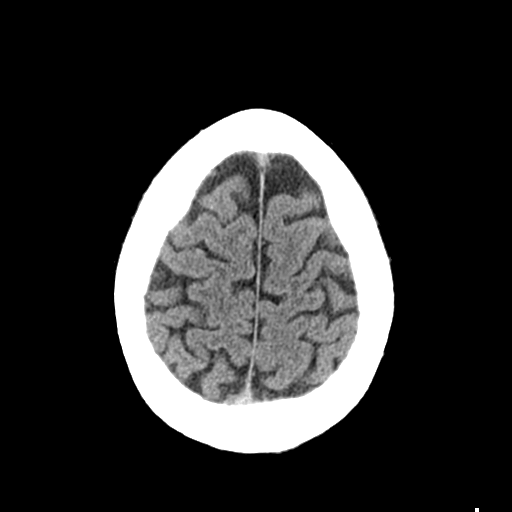
[im 26/32  brain]
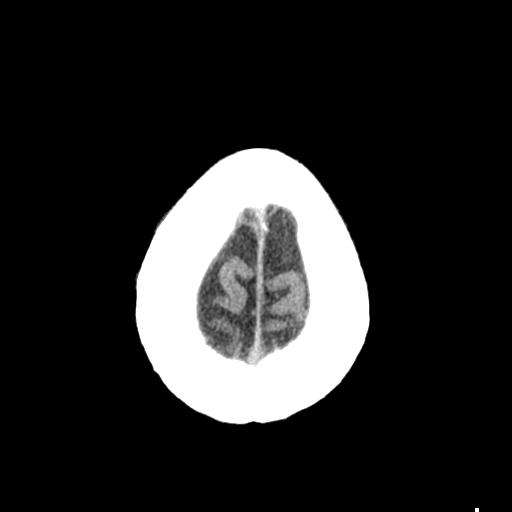
[im 29/32  brain]
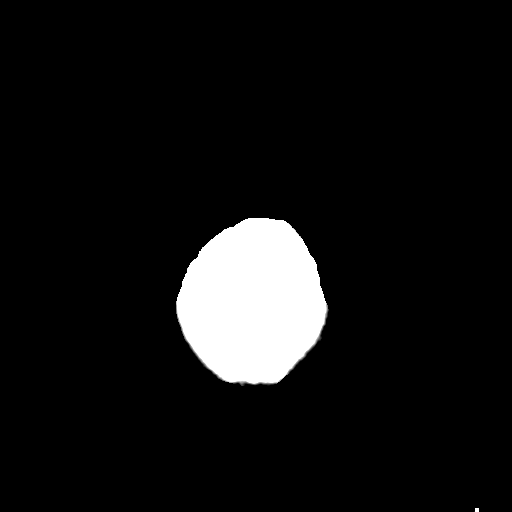
[im 29/32  bone]
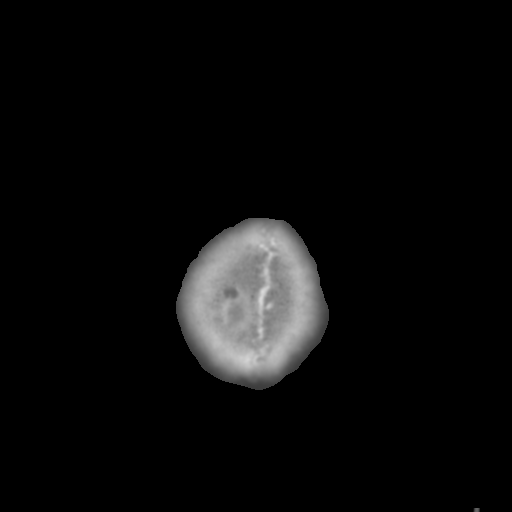

[Series 4: coronal soft tissue · coronal · 0.30mm/px · 3 of 73 slices shown]
[im 25/73  brain]
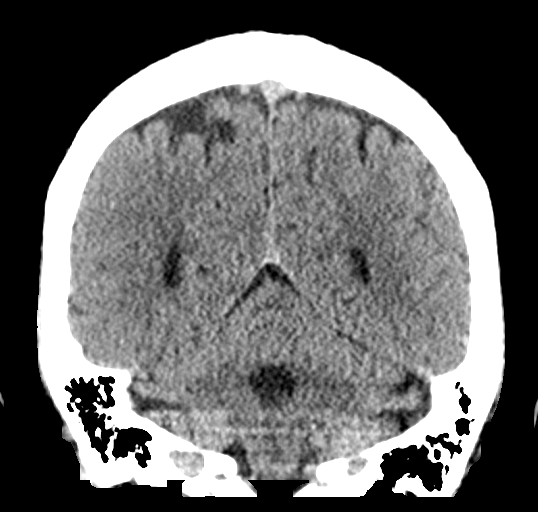
[im 33/73  brain]
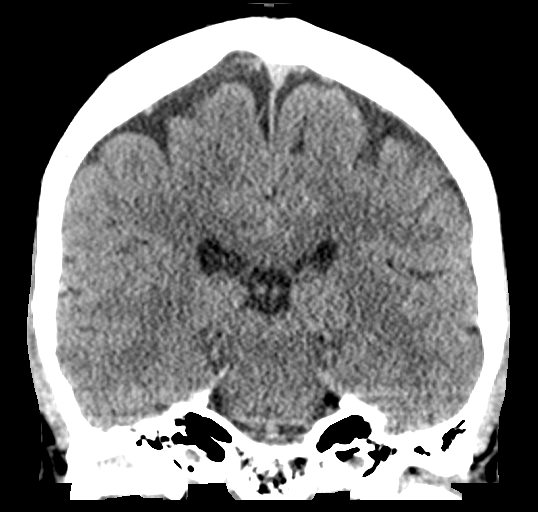
[im 41/73  brain]
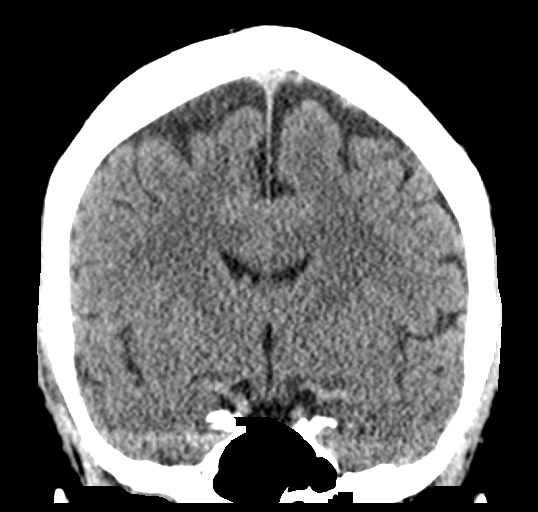

[Series 5: sagittal soft tissue · sagittal · 0.30mm/px · 3 of 55 slices shown]
[im 19/55  brain]
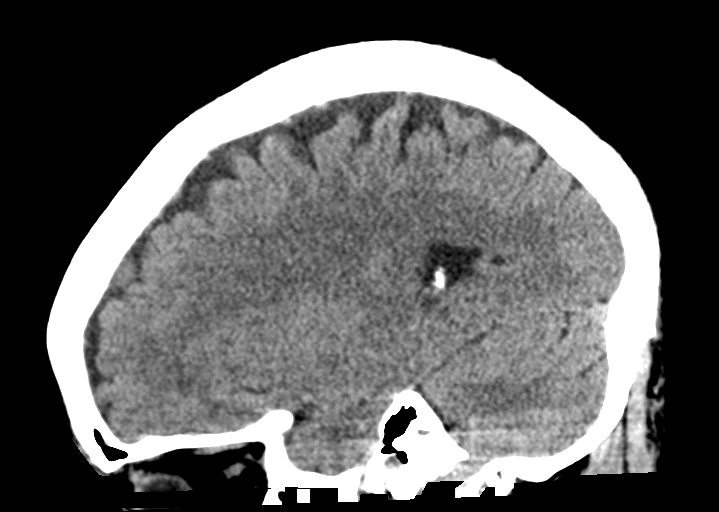
[im 28/55  brain]
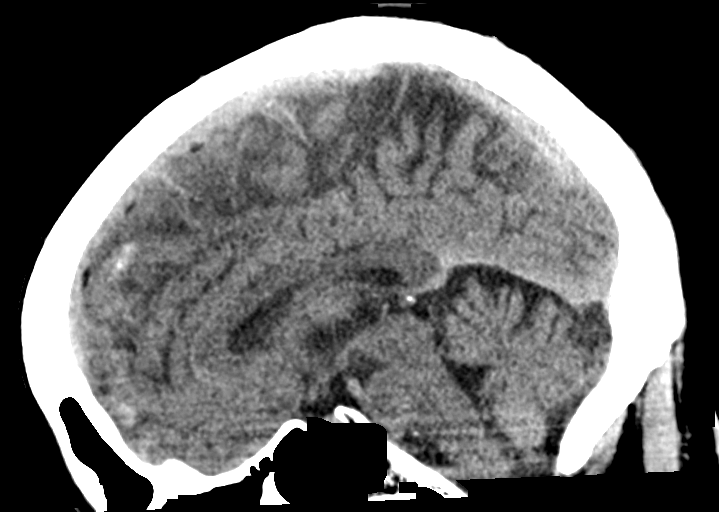
[im 37/55  brain]
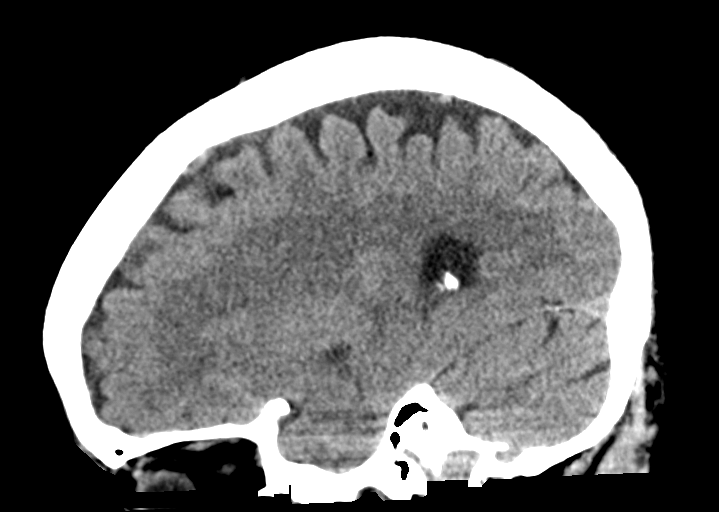

[15 of 47 positions shown; findings below may reference images not displayed]

FINDINGS: Brain: No evidence of acute infarction, hemorrhage, hydrocephalus,
extra-axial collection or mass lesion/mass effect.

Vascular: No hyperdense vessel or unexpected calcification.

Skull: Normal. Negative for fracture or focal lesion.

Sinuses/Orbits: No acute finding.

Other: None.
IMPRESSION: Unremarkable noncontrast head CT.

## 2021-02-15 ENCOUNTER — Other Ambulatory Visit: Payer: Self-pay | Admitting: Family Medicine

## 2021-02-15 ENCOUNTER — Other Ambulatory Visit (HOSPITAL_COMMUNITY): Payer: Self-pay | Admitting: Family Medicine

## 2021-02-15 DIAGNOSIS — M5414 Radiculopathy, thoracic region: Secondary | ICD-10-CM

## 2021-02-22 ENCOUNTER — Ambulatory Visit
Admission: RE | Admit: 2021-02-22 | Discharge: 2021-02-22 | Disposition: A | Discharge status: Home / Self Care | Payer: BC Managed Care – PPO | Source: Ambulatory Visit | Attending: Family Medicine | Admitting: Family Medicine

## 2021-02-22 ENCOUNTER — Other Ambulatory Visit: Payer: Self-pay

## 2021-02-22 DIAGNOSIS — M5414 Radiculopathy, thoracic region: Secondary | ICD-10-CM | POA: Insufficient documentation

## 2021-11-19 ENCOUNTER — Other Ambulatory Visit: Payer: Self-pay

## 2021-11-19 ENCOUNTER — Ambulatory Visit
Admission: EM | Admit: 2021-11-19 | Discharge: 2021-11-19 | Disposition: A | Discharge status: Home / Self Care | Payer: BC Managed Care – PPO | Attending: Student | Admitting: Student

## 2021-11-19 DIAGNOSIS — Z20822 Contact with and (suspected) exposure to covid-19: Secondary | ICD-10-CM | POA: Diagnosis not present

## 2021-11-19 DIAGNOSIS — Z1152 Encounter for screening for COVID-19: Secondary | ICD-10-CM

## 2021-11-19 DIAGNOSIS — B349 Viral infection, unspecified: Secondary | ICD-10-CM | POA: Diagnosis not present

## 2021-11-19 MED ORDER — PROMETHAZINE-DM 6.25-15 MG/5ML PO SYRP: 5.0000 mL | ORAL_SOLUTION | Freq: Four times a day (QID) | ORAL | 0 refills | Status: AC | PRN

## 2021-11-19 MED ORDER — PREDNISONE 20 MG PO TABS: 40.0000 mg | ORAL_TABLET | Freq: Every day | ORAL | 0 refills | Status: AC

## 2021-11-19 NOTE — ED Triage Notes (Signed)
Patient is here for "sinus drainage causing chest congestion, ha, sinus pain". No fever. Symptoms began "x4 days ago". No fever. No COVID19 testing since symptoms began. History of COVID19.

## 2021-11-19 NOTE — ED Provider Notes (Signed)
[facility]    CSN: [id number] Arrival date & time: 11/19/21  0844      History   Chief Complaint Chief Complaint  Patient presents with   Nasal Congestion   Cough    HPI Austin Hicks is a 56 y.o. male presenting with cough and nasal congestion for about 4 days.  History prediabetes, GERD, hypertension.  Describes nasal congestion, postnasal drip, cough productive of clear and yellow sputum, throbbing frontal headaches.  Denies fever/chills at home.  Has attempted over-the-counter cough syrup without relief.  Has not been tested for COVID yet.  Denies history of pulmonary disease; denies shortness of breath, chest pain.  HPI  Past Medical History:  Diagnosis Date   GERD (gastroesophageal reflux disease)    HLD (hyperlipidemia) 06/13/2018   Hypertension    Pre-diabetes 06/13/2018    Patient Active Problem List   Diagnosis Date Noted   HLD (hyperlipidemia) 06/13/2018   Pre-diabetes 06/13/2018    History reviewed. No pertinent surgical history.     Home Medications    Prior to Admission medications   Medication Sig Start Date End Date Taking? Authorizing Provider  acetaminophen (TYLENOL) 325 MG tablet Take 650 mg by mouth every 6 (six) hours as needed.   Yes [provider]  amLODipine (NORVASC) 5 MG tablet Take 5 mg by mouth daily. 10/18/21  Yes [provider]  gabapentin (NEURONTIN) 100 MG capsule Take 200 mg by mouth 3 (three) times daily. 09/09/21  Yes [provider]  lovastatin (MEVACOR) 40 MG tablet SMARTSIG:1 Tablet(s) By Mouth Every Evening 09/07/21  Yes [provider]  omeprazole (PRILOSEC) 40 MG capsule Take 40 mg by mouth daily. 10/24/21  Yes [provider]  predniSONE (DELTASONE) 20 MG tablet Take 2 tablets (40 mg total) by mouth daily for 5 days. Take with breakfast or lunch. Avoid NSAIDs (ibuprofen, etc) while taking this medication. 11/19/21 11/24/21 Yes Hazel Sams, PA-C   promethazine-dextromethorphan (PROMETHAZINE-DM) 6.25-15 MG/5ML syrup Take 5 mLs by mouth 4 (four) times daily as needed for cough. 11/19/21  Yes Hazel Sams, PA-C  butalbital-acetaminophen-caffeine (FIORICET) [phone] MG tablet Take 1 tablet by mouth every 4 (four) hours as needed for headache. 12/19/19   Paulette Blanch, MD  lidocaine (XYLOCAINE) 2 % solution Use as directed 15 mLs in the mouth or throat as needed for mouth pain. 01/25/20   Nance Pear, MD  ondansetron (ZOFRAN ODT) 4 MG disintegrating tablet Take 1 tablet (4 mg total) by mouth every 8 (eight) hours as needed for nausea or vomiting. 01/05/20   Paulette Blanch, MD  oxyCODONE-acetaminophen (PERCOCET/ROXICET) 5-325 MG tablet Take 1 tablet by mouth every 4 (four) hours as needed for severe pain. 01/05/20   Paulette Blanch, MD  pantoprazole (PROTONIX) 40 MG tablet Take 1 tablet (40 mg total) by mouth daily. 11/18/19 11/17/20  Gregor Hams, MD  sucralfate (CARAFATE) 1 g tablet Take 1 tablet (1 g total) by mouth 4 (four) times daily. 01/25/20   Nance Pear, MD  tamsulosin (FLOMAX) 0.4 MG CAPS capsule Take 1 capsule (0.4 mg total) by mouth daily after supper. 10/20/18   Merlyn Lot, MD    Family History No family history on file.  Social History Social History   Tobacco Use   Smoking status: Former   Smokeless tobacco: Never  Scientific laboratory technician Use: Never used  Substance Use Topics   Alcohol use: Yes   Drug use: No     Allergies  Wasp venom   Review of Systems Review of Systems  Constitutional:  Negative for appetite change, chills and fever.  HENT:  Positive for congestion. Negative for ear pain, rhinorrhea, sinus pressure, sinus pain and sore throat.   Eyes:  Negative for redness and visual disturbance.  Respiratory:  Positive for cough. Negative for chest tightness, shortness of breath and wheezing.   Cardiovascular:  Negative for chest pain and palpitations.  Gastrointestinal:  Negative for abdominal pain,  constipation, diarrhea, nausea and vomiting.  Genitourinary:  Negative for dysuria, frequency and urgency.  Musculoskeletal:  Negative for myalgias.  Neurological:  Negative for dizziness, weakness and headaches.  Psychiatric/Behavioral:  Negative for confusion.   All other systems reviewed and are negative.   Physical Exam Triage Vital Signs ED Triage Vitals  Enc Vitals Group     BP 11/19/21 0857 134/86     Pulse Rate 11/19/21 0857 83     Resp 11/19/21 0857 18     Temp 11/19/21 0857 98.2 F (36.8 C)     Temp Source 11/19/21 0857 Oral     SpO2 11/19/21 0857 98 %     Weight 11/19/21 0853 190 lb 14.7 oz (86.6 kg)     Height 11/19/21 0853 5\' 10"  (1.778 m)     Head Circumference --      Peak Flow --      Pain Score 11/19/21 0853 0     Pain Loc --      Pain Edu? --      Excl. in [place]? --    No data found.  Updated Vital Signs BP 134/86 (BP Location: Left Arm)    Pulse 83    Temp 98.2 F (36.8 C) (Oral)    Resp 18    Ht 5\' 10"  (1.778 m)    Wt 190 lb 14.7 oz (86.6 kg)    SpO2 98%    BMI 27.39 kg/m   Visual Acuity Right Eye Distance:   Left Eye Distance:   Bilateral Distance:    Right Eye Near:   Left Eye Near:    Bilateral Near:     Physical Exam Vitals reviewed.  Constitutional:      General: He is not in acute distress.    Appearance: Normal appearance. He is not ill-appearing.  HENT:     Head: Normocephalic and atraumatic.     Right Ear: Tympanic membrane, ear canal and external ear normal. No tenderness. No middle ear effusion. There is no impacted cerumen. Tympanic membrane is not perforated, erythematous, retracted or bulging.     Left Ear: Tympanic membrane, ear canal and external ear normal. No tenderness.  No middle ear effusion. There is no impacted cerumen. Tympanic membrane is not perforated, erythematous, retracted or bulging.     Nose: Congestion present.     Right Sinus: No maxillary sinus tenderness or frontal sinus tenderness.     Left Sinus: No  maxillary sinus tenderness or frontal sinus tenderness.     Mouth/Throat:     Mouth: Mucous membranes are moist.     Pharynx: Uvula midline. Posterior oropharyngeal erythema present. No oropharyngeal exudate.     Comments: Erythema and cobblestoning posterior pharynx. On exam, uvula is midline, he is tolerating secretions without difficulty, there is no trismus, no drooling, he has normal phonation  Eyes:     Extraocular Movements: Extraocular movements intact.     Pupils: Pupils are equal, round, and reactive to light.  Cardiovascular:     Rate and  Rhythm: Normal rate and regular rhythm.     Heart sounds: Normal heart sounds.  Pulmonary:     Effort: Pulmonary effort is normal.     Breath sounds: Normal breath sounds. No decreased breath sounds, wheezing, rhonchi or rales.  Abdominal:     Palpations: Abdomen is soft.     Tenderness: There is no abdominal tenderness. There is no guarding or rebound.  Lymphadenopathy:     Cervical: No cervical adenopathy.     Right cervical: No superficial cervical adenopathy.    Left cervical: No superficial cervical adenopathy.  Neurological:     General: No focal deficit present.     Mental Status: He is alert and oriented to person, place, and time.  Psychiatric:        Mood and Affect: Mood normal.        Behavior: Behavior normal.        Thought Content: Thought content normal.        Judgment: Judgment normal.     UC Treatments / Results  Labs (all labs ordered are listed, but only abnormal results are displayed) Labs Reviewed  SARS CORONAVIRUS 2 (TAT 6-24 HRS)    EKG   Radiology No results found.  Procedures Procedures (including critical care time)  Medications Ordered in UC Medications - No data to display  Initial Impression / Assessment and Plan / UC Course  I have reviewed the triage vital signs and the nursing notes.  Pertinent labs & imaging results that were available during my care of the patient were reviewed by  me and considered in my medical decision making (see chart for details).     This patient is a very pleasant 56 y.o. year old male presenting with viral syndrome x4 days. Afebrile, nontachy. Reassuring exam, he is nontoxic appearing  Covid PCR sent. Symptoms x4-5 days, this would not change management.  Low-dose prednisone, promethazine DM as below.   ED return precautions discussed. Patient verbalizes understanding and agreement.     Final Clinical Impressions(s) / UC Diagnoses   Final diagnoses:  Viral syndrome  Encounter for screening for COVID-19     Discharge Instructions      -Prednisone, 2 pills taken at the same time for 5 days in a row.  Try taking this earlier in the day as it can give you energy. Avoid NSAIDs like ibuprofen and alleve while taking this medication as they can increase your risk of stomach upset and even GI bleeding when in combination with a steroid. You can continue tylenol (acetaminophen) up to 1000mg  3x daily. -Promethazine DM cough syrup for congestion/cough. This could make you drowsy, so take at night before bed. -Continue over-the-counter medications for additional relief -With a virus, you're typically contagious for 5-7 days, or as long as you're having fevers.     ED Prescriptions     Medication Sig Dispense Auth. Provider   predniSONE (DELTASONE) 20 MG tablet Take 2 tablets (40 mg total) by mouth daily for 5 days. Take with breakfast or lunch. Avoid NSAIDs (ibuprofen, etc) while taking this medication. 10 tablet Hazel Sams, PA-C   promethazine-dextromethorphan (PROMETHAZINE-DM) 6.25-15 MG/5ML syrup Take 5 mLs by mouth 4 (four) times daily as needed for cough. 118 mL Hazel Sams, PA-C      PDMP not reviewed this encounter.   Hazel Sams, PA-C 11/19/21 [phone]

## 2021-11-19 NOTE — Discharge Instructions (Addendum)
-  Prednisone, 2 pills taken at the same time for 5 days in a row.  Try taking this earlier in the day as it can give you energy. Avoid NSAIDs like ibuprofen and alleve while taking this medication as they can increase your risk of stomach upset and even GI bleeding when in combination with a steroid. You can continue tylenol (acetaminophen) up to 1000mg 3x daily. -Promethazine DM cough syrup for congestion/cough. This could make you drowsy, so take at night before bed. -Continue over-the-counter medications for additional relief. -With a virus, you're typically contagious for 5-7 days, or as long as you're having fevers.  

## 2021-11-20 LAB — SARS CORONAVIRUS 2 (TAT 6-24 HRS): SARS Coronavirus 2: NEGATIVE

## 2021-12-19 ENCOUNTER — Ambulatory Visit: Payer: Managed Care, Other (non HMO) | Admitting: Family Medicine

## 2022-03-23 ENCOUNTER — Ambulatory Visit: Payer: Self-pay | Admitting: *Deleted

## 2022-03-23 NOTE — Telephone Encounter (Signed)
Patient called to requesting a new patient appt. Patient reports he had a new patient appt scheduled 12/19/21 and missed the appt due to he did not have a working cell phone and was unable to get access to My Chart account to verify appt. Patient reports he did not know number to call and has recently gotten a new phone and would like to know if he can reschedule a new patient appt now that he has a working phone. # [phone] is patient' s number.  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 Please advise .

## 2022-03-23 NOTE — Telephone Encounter (Signed)
Patient called, left VM to return the call to [facility] for new patient appointment assistance. Originally scheduled with Dr. Althea Charon, but was a No Show due to no working phone and unable to verify appointment, did not know the number to call back to. Only NP appointments I could see is in January with either provider. Will route to [facility] for assistance in scheduling.

## 2022-03-29 NOTE — Telephone Encounter (Signed)
Pt is rescheduled for 10/04/2022    Thanks,   -Vernona Rieger

## 2022-10-04 ENCOUNTER — Ambulatory Visit: Payer: Managed Care, Other (non HMO) | Admitting: Family Medicine

## 2023-08-06 ENCOUNTER — Ambulatory Visit: Payer: BC Managed Care – PPO | Admitting: Podiatry

## 2023-08-25 ENCOUNTER — Ambulatory Visit: Payer: Self-pay

## 2023-09-03 ENCOUNTER — Encounter: Payer: Self-pay | Admitting: Podiatry

## 2023-09-03 ENCOUNTER — Ambulatory Visit (INDEPENDENT_AMBULATORY_CARE_PROVIDER_SITE_OTHER): Payer: BC Managed Care – PPO | Admitting: Podiatry

## 2023-09-03 DIAGNOSIS — B351 Tinea unguium: Secondary | ICD-10-CM

## 2023-09-03 DIAGNOSIS — M7741 Metatarsalgia, right foot: Secondary | ICD-10-CM | POA: Diagnosis not present

## 2023-09-03 DIAGNOSIS — M7742 Metatarsalgia, left foot: Secondary | ICD-10-CM

## 2023-09-03 MED ORDER — TERBINAFINE HCL 250 MG PO TABS: 250.0000 mg | ORAL_TABLET | Freq: Every day | ORAL | 0 refills | Status: AC

## 2023-09-03 NOTE — Patient Instructions (Signed)
VISIT SUMMARY:  You came in today because of thickened toenails that have been causing you discomfort and difficulty wearing shoes for the past six months. We discussed your work environment and footwear needs, and reviewed your recent medical history, including your prediabetic status and recent antibiotic use.  YOUR PLAN:  -ONYCHOMYCOSIS: Onychomycosis is a fungal infection of the toenails that causes them to become thickened and discolored. We have decided to start you on Terbinafine 250mg  daily for 3 months, which has an 80% success rate. Be aware of potential side effects like gastrointestinal issues and muscle fatigue. Use antifungal spray in your work shoes daily and allow them to dry out to reduce fungal exposure. We will follow up in 4 months to see how you are responding to the treatment.  -FOOTWEAR: Given your work on cement floors, you need comfortable, non-slip, waterproof shoes. We recommend the [employer] brand, specifically the [facility] SR model, which you can find online or at a running shoe store.  INSTRUCTIONS:  Please follow up in 4 months to evaluate your response to the treatment for onychomycosis. Make sure to take Terbinafine 250mg  daily as prescribed and use antifungal spray in your work shoes daily.

## 2023-09-04 NOTE — Progress Notes (Signed)
  Subjective:  Patient ID: Austin Hicks, male    DOB: [birth year],  MRN: [id number]  Chief Complaint  Patient presents with   Nail Problem    "These toenails are tough.  They hurt when I put my shoes on.  They're growing funny."    Discussed the use of AI scribe software for clinical note transcription with the patient, who gave verbal consent to proceed.  History of Present Illness   The patient presents with thickened toenails, which have been causing discomfort and difficulty wearing shoes for the past six months. He initially thought the nails would fall off, but they have not. 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 He has no known medical issues but is prediabetic. He recently completed a course of antibiotics for a sinus infection.          Objective:    Physical Exam   CARDIOVASCULAR: Good capillary fill time, palpable pulses. SKIN: Severely mycotic dystrophic multiple toenails on both feet, worst affected areas bilateral hallux.            Results   Procedure: Toenail trimming Description: Thickened, mycotic toenails were trimmed to reduce thickness. Informed Consent: Discussion included the difficulty of treating onychomycosis, the best treatment options including oral terbinafine (Lamisil), its success rate, potential side effects, and alternatives such as regular trimming or permanent removal of toenails.  LABS Metabolic panel: Normal liver function testing (12/30/2022)      Assessment:   1. Onychomycosis   2. Metatarsalgia of both feet      Plan:  Patient was evaluated and treated and all questions answered.  Assessment and Plan    Onychomycosis   Severe, dystrophic, mycotic toenails bilaterally have been causing him discomfort and difficulty wearing shoes. We discussed the option of starting Terbinafine 250mg  daily for 3 months, highlighting its 80%  success rate and potential side effects such as GI issues and muscle fatigue. He understands the importance of consistent dosing. Additionally, we recommended the use of antifungal spray in his work shoes daily and allowing the shoes to dry out to reduce fungal exposure. A follow-up in 4 months is scheduled to evaluate his response to treatment.  Footwear   He works on [employer] and requires comfortable, non-slip, waterproof shoes. We recommended the [employer] brand, specifically the [employer] SR model, which can be purchased online or at a running shoe store.          Return in about 4 months (around 01/02/2024) for follow up after nail fungus treatment.

## 2023-09-05 ENCOUNTER — Telehealth: Payer: Self-pay

## 2023-09-05 NOTE — Telephone Encounter (Signed)
Patient called and left a message. The terbinafine makes him sick to his stomch, so he is just going to stop the medicine. Thanks

## 2023-09-06 MED ORDER — EFINACONAZOLE 10 % EX SOLN: 1.0000 [drp] | Freq: Every day | CUTANEOUS | 11 refills | Status: AC

## 2023-09-06 NOTE — Addendum Note (Signed)
Addended byLilian Kapur, Montravious Weigelt R on: 09/06/2023 03:49 PM   Modules accepted: Orders

## 2024-01-02 ENCOUNTER — Ambulatory Visit (INDEPENDENT_AMBULATORY_CARE_PROVIDER_SITE_OTHER): Payer: BC Managed Care – PPO | Admitting: Podiatry

## 2024-01-02 DIAGNOSIS — Z91199 Patient's noncompliance with other medical treatment and regimen due to unspecified reason: Secondary | ICD-10-CM

## 2024-01-03 NOTE — Progress Notes (Signed)
 Patient was no-show for appointment today

## 2024-03-02 ENCOUNTER — Ambulatory Visit: Admission: EM | Admit: 2024-03-02 | Discharge: 2024-03-02 | Disposition: A | Discharge status: Home / Self Care

## 2024-03-02 DIAGNOSIS — H5712 Ocular pain, left eye: Secondary | ICD-10-CM | POA: Diagnosis not present

## 2024-03-02 MED ORDER — KETOROLAC TROMETHAMINE 0.5 % OP SOLN: 1.0000 [drp] | Freq: Four times a day (QID) | OPHTHALMIC | 0 refills | Status: AC

## 2024-03-02 MED ORDER — ERYTHROMYCIN 5 MG/GM OP OINT: TOPICAL_OINTMENT | OPHTHALMIC | 0 refills | Status: AC

## 2024-03-02 NOTE — ED Triage Notes (Signed)
 Pt presents to UC for c/o eye redness x2 days. Pt reports it feels "like gravel in my eyes." Unable to sleep d/t irritation.

## 2024-03-02 NOTE — Discharge Instructions (Addendum)
 Today you have been evaluated for eye pain, concern with possible infection after you have been started on antibiotic eyedrops  May Flush eyes as needed will solution given by clinic  Place one drop of erythromycin into the effected eye every 8 hours while awake for 7 days. If the other eye starts to have symptoms you may use medication in it as well. Do not allow tip of dropper to touch eye  Apply ketorolac eyedrops every 6 hours, helps with inflammation and pain.  May use cool compress for comfort and to remove discharge if present. Pat the eye, do not wipe.  Do not rub eyes, this may cause more irritation.  If symptoms persist after use of medication, please follow up at Urgent Care or with ophthalmologist (eye doctor)

## 2024-03-05 NOTE — ED Provider Notes (Signed)
 Austin Hicks    CSN: [id number] Arrival date & time: 03/02/24  1459      History   Chief Complaint No chief complaint on file.   HPI Austin Hicks is a 58 y.o. male.   Patient presents for evaluation of left eye pain, redness and irritation present for 2 days.  Experienced increased watering.  Feels like there is dirt within the eye which she is unable to remove.  Denies injury or trauma, blurred vision or light sensitivity.  Past Medical History:  Diagnosis Date   GERD (gastroesophageal reflux disease)    HLD (hyperlipidemia) 06/13/2018   Hypertension    Pre-diabetes 06/13/2018    Patient Active Problem List   Diagnosis Date Noted   HLD (hyperlipidemia) 06/13/2018   Pre-diabetes 06/13/2018    No past surgical history on file.     Home Medications    Prior to Admission medications   Medication Sig Start Date End Date Taking? Authorizing Provider  atenolol (TENORMIN) 25 MG tablet Take 1 tablet by mouth daily. 10/24/23 01/23/25 Yes [provider]  atorvastatin (LIPITOR) 40 MG tablet 1 tablet. 10/16/23 10/15/24 Yes [provider]  erythromycin ophthalmic ointment Place a 1/2 inch ribbon of ointment into the lower eyelid every 8 hours for 7 days 03/02/24  Yes Apostolos Blagg R, NP  hydrochlorothiazide (HYDRODIURIL) 25 MG tablet Take 1 tablet by mouth daily. 11/04/23 01/30/25 Yes [provider]  ketorolac (ACULAR) 0.5 % ophthalmic solution Place 1 drop into both eyes every 6 (six) hours. 03/02/24  Yes Birch Farino R, NP  metFORMIN (GLUCOPHAGE) 500 MG tablet TAKE 1 TABLET BY MOUTH IN THE MORNING AND 1 IN THE EVENING WITH MEALS 09/20/22  Yes [provider]  omeprazole (PRILOSEC) 40 MG capsule Take 1 capsule by mouth daily. 08/20/23 08/19/24 Yes [provider]  sildenafil (VIAGRA) 50 MG tablet Take 50 mg by mouth. 12/12/23 12/11/24 Yes [provider]  tamsulosin  (FLOMAX ) 0.4 MG CAPS capsule Take 0.4 mg by mouth. 11/28/23  11/27/24 Yes [provider]  acetaminophen  (TYLENOL ) 325 MG tablet Take 650 mg by mouth every 6 (six) hours as needed.    [provider]  amLODipine (NORVASC) 5 MG tablet Take 5 mg by mouth daily. 10/18/21   [provider]  butalbital -acetaminophen -caffeine  (FIORICET ) 50-325-40 MG tablet Take 1 tablet by mouth every 4 (four) hours as needed for headache. Patient not taking: Reported on 09/03/2023 12/19/19   Sung, Jade J, MD  Efinaconazole  10 % SOLN Apply 1 drop topically daily. 09/06/23   McDonald, Olive Better, DPM  gabapentin (NEURONTIN) 100 MG capsule Take 200 mg by mouth 3 (three) times daily. Patient not taking: Reported on 09/03/2023 09/09/21   [provider]  lidocaine  (XYLOCAINE ) 2 % solution Use as directed 15 mLs in the mouth or throat as needed for mouth pain. Patient not taking: Reported on 09/03/2023 01/25/20   Goodman, Graydon, MD  lovastatin (MEVACOR) 40 MG tablet SMARTSIG:1 Tablet(s) By Mouth Every Evening Patient not taking: Reported on 09/03/2023 09/07/21   [provider]  omeprazole (PRILOSEC) 40 MG capsule Take 40 mg by mouth daily. 10/24/21   [provider]  ondansetron  (ZOFRAN  ODT) 4 MG disintegrating tablet Take 1 tablet (4 mg total) by mouth every 8 (eight) hours as needed for nausea or vomiting. Patient not taking: Reported on 09/03/2023 01/05/20   Sung, Jade J, MD  oxyCODONE -acetaminophen  (PERCOCET/ROXICET) 5-325 MG tablet Take 1 tablet by mouth every 4 (four) hours as needed for  severe pain. Patient not taking: Reported on 09/03/2023 01/05/20   Sung, Jade J, MD  pantoprazole  (PROTONIX ) 40 MG tablet Take 1 tablet (40 mg total) by mouth daily. 11/18/19 11/17/20  Dannial Duty, MD  promethazine -dextromethorphan (PROMETHAZINE -DM) 6.25-15 MG/5ML syrup Take 5 mLs by mouth 4 (four) times daily as needed for cough. Patient not taking: Reported on 09/03/2023 11/19/21   Graham, Laura E, PA-C  sucralfate  (CARAFATE ) 1 g tablet Take 1 tablet (1  g total) by mouth 4 (four) times daily. Patient not taking: Reported on 09/03/2023 01/25/20   Marylynn Soho, MD  tamsulosin  (FLOMAX ) 0.4 MG CAPS capsule Take 1 capsule (0.4 mg total) by mouth daily after supper. 10/20/18   Suellyn Emory, MD    Family History No family history on file.  Social History Social History   Tobacco Use   Smoking status: Former   Smokeless tobacco: Never  Vaping Use   Vaping status: Never Used  Substance Use Topics   Alcohol use: Yes    Alcohol/week: 2.0 standard drinks of alcohol    Types: 2 Cans of beer per week    Comment: every weekend   Drug use: No     Allergies   Wasp venom   Review of Systems Review of Systems   Physical Exam Triage Vital Signs ED Triage Vitals  Encounter Vitals Group     BP 03/02/24 1538 119/79     Systolic BP Percentile --      Diastolic BP Percentile --      Pulse Rate 03/02/24 1538 76     Resp 03/02/24 1538 18     Temp 03/02/24 1538 99 F (37.2 C)     Temp Source 03/02/24 1538 Oral     SpO2 03/02/24 1538 96 %     Weight --      Height --      Head Circumference --      Peak Flow --      Pain Score 03/02/24 1534 6     Pain Loc --      Pain Education --      Exclude from Growth Chart --    No data found.  Updated Vital Signs BP 119/79 (BP Location: Left Arm)   Pulse 76   Temp 99 F (37.2 C) (Oral)   Resp 18   SpO2 96%   Visual Acuity Right Eye Distance:   Left Eye Distance:   Bilateral Distance:    Right Eye Near:   Left Eye Near:    Bilateral Near:     Physical Exam Constitutional:      Appearance: Normal appearance.  HENT:     Right Ear: Tympanic membrane, ear canal and external ear normal.     Left Ear: Tympanic membrane, ear canal and external ear normal.  Eyes:     Extraocular Movements: Extraocular movements intact.     Comments: Irritation to the left eye, no drainage noted, vision grossly intact, extraocular movements intact  Pulmonary:     Effort: Pulmonary effort is  normal.  Neurological:     Mental Status: He is alert and oriented to person, place, and time. Mental status is at baseline.      UC Treatments / Results  Labs (all labs ordered are listed, but only abnormal results are displayed) Labs Reviewed - No data to display  EKG   Radiology No results found.  Procedures Procedures (including critical care time)  Medications Ordered in UC Medications - No data to  display  Initial Impression / Assessment and Plan / UC Course  I have reviewed the triage vital signs and the nursing notes.  Pertinent labs & imaging results that were available during my care of the patient were reviewed by me and considered in my medical decision making (see chart for details).  Left eye pain  Water irrigation completed, some improvement with symptoms on reevaluation, deferring fluorescein staining today, prescribed erythromycin ointment as well as ketorolac, discussed administration, advised against eye rubbing and touching, recommended supportive care and given walker referral to ophthalmology if symptoms continue to persist   Final Clinical Impressions(s) / UC Diagnoses   Final diagnoses:  Left eye pain   Discharge Instructions      Today you have been evaluated for eye pain, concern with possible infection after you have been started on antibiotic eyedrops  May Flush eyes as needed will solution given by clinic  Place one drop of erythromycin into the effected eye every 8 hours while awake for 7 days. If the other eye starts to have symptoms you may use medication in it as well. Do not allow tip of dropper to touch eye  Apply ketorolac eyedrops every 6 hours, helps with inflammation and pain.  May use cool compress for comfort and to remove discharge if present. Pat the eye, do not wipe.  Do not rub eyes, this may cause more irritation.  If symptoms persist after use of medication, please follow up at Urgent Care or with ophthalmologist  (eye doctor)   ED Prescriptions     Medication Sig Dispense Auth. Provider   erythromycin ophthalmic ointment Place a 1/2 inch ribbon of ointment into the lower eyelid every 8 hours for 7 days 3.5 g Tanya Crothers R, NP   ketorolac (ACULAR) 0.5 % ophthalmic solution Place 1 drop into both eyes every 6 (six) hours. 5 mL Reena Canning, NP      PDMP not reviewed this encounter.   Reena Canning, [place] 03/05/24 [phone]

## 2024-04-28 ENCOUNTER — Ambulatory Visit (INDEPENDENT_AMBULATORY_CARE_PROVIDER_SITE_OTHER): Admitting: Podiatry

## 2024-04-28 ENCOUNTER — Encounter: Payer: Self-pay | Admitting: Podiatry

## 2024-04-28 VITALS — BP 127/75 | HR 77 | Temp 97.9°F

## 2024-04-28 DIAGNOSIS — B353 Tinea pedis: Secondary | ICD-10-CM | POA: Diagnosis not present

## 2024-04-28 DIAGNOSIS — B351 Tinea unguium: Secondary | ICD-10-CM

## 2024-04-28 MED ORDER — TERBINAFINE HCL 250 MG PO TABS: 250.0000 mg | ORAL_TABLET | Freq: Every day | ORAL | 0 refills | Status: AC

## 2024-04-28 NOTE — Progress Notes (Signed)
  Subjective:  Patient ID: Austin Hicks, male    DOB: May 12, 1966,  MRN: [id number]  Chief Complaint  Patient presents with   Ingrown Toenail    Rm 6 Patient is here for ingrown toe nails of the right and left hallux. Both hallux nails are discolored with a purulent drainage with signs of infection. Patient has soaked nails in epsom to help with pain and discomfort.    58 y.o. male presents with the above complaint. History confirmed with patient. Completed prior lamisil  course last year  Objective:  Physical Exam: warm, good capillary refill, no trophic changes or ulcerative lesions, normal DP and PT pulses, normal sensory exam, onychomycosis, and severe dystrophy and loosening of bilateral hallux and R 4th nail, tinea pedis with blistering and erythematous rash on multiple nails  Assessment:   1. Onychomycosis   2. Tinea pedis of both feet      Plan:  Patient was evaluated and treated and all questions answered.   Onychomycosis, tinea pedis -Educated on etiology of nail fungus -Due to severe dystrophy recommended temporary removal of bilateral hallux and R 4th toenail -Rx 90 days lamisil  sent to pharmacy. Expect this will resolve tinea as wel     mycotic Nail, bilaterally -Patient elects to proceed with minor surgery to remove toenails today. Consent reviewed and signed by patient. -Affected nails excised. See procedure note. -Educated on post-procedure care including soaking. Written instructions provided and reviewed.   Procedure: Excision of Toenail Location: Bilateral 1st and R 4th nail Anesthesia: Lidocaine  1% plain; 1.5 mL and Marcaine 0.5% plain; 1.5 mL, digital block. Skin Prep: Betadine. Dressing: Silvadene; telfa; dry, sterile, compression dressing. Technique: Following skin prep, the toe was exsanguinated and a tourniquet was secured at the base of the toe. The affected nail border was freed and excised. It was irrigated out with alcohol. The tourniquet was then  removed and sterile dressing applied. Disposition: Patient tolerated procedure well.      Return if symptoms worsen or fail to improve.

## 2024-04-28 NOTE — Patient Instructions (Signed)
 Place 1/4 cup of epsom salts in a quart of warm tap water.  Submerge your foot or feet in the solution and soak for 20 minutes.  This soak should be done twice a day.  Next, remove your foot or feet from solution, blot dry the affected area. Apply neosporin and cover with a bandaid. Do this for 2 weeks then leave open to air. Take tylenol  and/or ibuprofen for pain as needed  IF YOUR SKIN BECOMES IRRITATED WHILE USING THESE INSTRUCTIONS, IT IS OKAY TO SWITCH TO  WHITE VINEGAR AND WATER.  As another alternative soak, you may use antibacterial soap and water.  Monitor for any signs/symptoms of infection. Call the office immediately if any occur or go directly to the emergency room. Call with any questions/concerns.

## 2024-04-29 ENCOUNTER — Encounter: Payer: Self-pay | Admitting: Podiatry

## 2024-04-30 ENCOUNTER — Encounter: Payer: Self-pay | Admitting: Podiatry

## 2024-05-01 ENCOUNTER — Telehealth: Payer: Self-pay | Admitting: Podiatry

## 2024-05-01 ENCOUNTER — Encounter: Payer: Self-pay | Admitting: Podiatry

## 2024-05-01 NOTE — Telephone Encounter (Signed)
 Spoke to patient stated he started wearing his shoes on today for work has been wearing open toe shoes states he will call and schedule appoint if continues to happen. We discussed aftercare with proper soaks has understanding.

## 2024-05-01 NOTE — Telephone Encounter (Signed)
 Patient called stating he put shoes on today and now his foot is bleeding what should he do. Pt had a procedure done the other day.

## 2024-05-12 ENCOUNTER — Telehealth: Payer: Self-pay | Admitting: Podiatry

## 2024-05-12 NOTE — Telephone Encounter (Signed)
 Patient called asking when can he wash his feet with soap and water? Had toe nails removed.

## 2024-05-12 NOTE — Telephone Encounter (Signed)
 Can resume regular bathing now

## 2024-05-14 ENCOUNTER — Ambulatory Visit: Admitting: Podiatry

## 2024-05-28 ENCOUNTER — Ambulatory Visit (INDEPENDENT_AMBULATORY_CARE_PROVIDER_SITE_OTHER): Admitting: Podiatry

## 2024-05-28 DIAGNOSIS — B353 Tinea pedis: Secondary | ICD-10-CM

## 2024-05-28 DIAGNOSIS — B351 Tinea unguium: Secondary | ICD-10-CM

## 2024-05-28 MED ORDER — CLOTRIMAZOLE-BETAMETHASONE 1-0.05 % EX CREA: 1.0000 | TOPICAL_CREAM | Freq: Every day | CUTANEOUS | 2 refills | Status: AC

## 2024-05-30 NOTE — Progress Notes (Signed)
  Subjective:  Patient ID: Austin Hicks, male    DOB: 1965/12/25,  MRN: [id number]  Chief Complaint  Patient presents with   Ingrown Toenail    Room 6 ingrown toenail follow up    58 y.o. male presents with the above complaint. History confirmed with patient.  Doing well not having any issues the skin rashes still present  Objective:  Physical Exam: warm, good capillary refill, no trophic changes or ulcerative lesions, normal DP and PT pulses, normal sensory exam, onychomycosis, and s nail avulsion sites healing well, tinea pedis with blistering and erythematous rash on multiple nails  Assessment:   1. Onychomycosis   2. Tinea pedis of both feet      Plan:  Patient was evaluated and treated and all questions answered.   Onychomycosis, tinea pedis - Nail removal site is doing well he is taking the Lamisil  as directed.  I prescribed Lotrisone  cream as well for the skin rash follow-up in 3 months to reevaluate     Return in about 3 months (around 08/27/2024) for follow up after nail fungus treatment.

## 2024-07-21 ENCOUNTER — Ambulatory Visit (INDEPENDENT_AMBULATORY_CARE_PROVIDER_SITE_OTHER): Admitting: Podiatry

## 2024-07-21 ENCOUNTER — Ambulatory Visit: Admitting: Podiatry

## 2024-07-21 DIAGNOSIS — Z91199 Patient's noncompliance with other medical treatment and regimen due to unspecified reason: Secondary | ICD-10-CM

## 2024-07-23 NOTE — Progress Notes (Signed)
 Patient was no-show for appointment today

## 2024-08-27 ENCOUNTER — Ambulatory Visit: Admitting: Podiatry

## 2024-09-15 ENCOUNTER — Ambulatory Visit: Admitting: Podiatry

## 2024-11-24 ENCOUNTER — Ambulatory Visit: Admitting: Podiatry
